# Patient Record
Sex: Male | Born: 1970 | Race: White | Hispanic: No | Smoking: Current every day smoker
Health system: Southern US, Community
[De-identification: ages and names within clinical notes are randomized; demographics above are authoritative.]

## PROBLEM LIST (undated history)

## (undated) DIAGNOSIS — A879 Viral meningitis, unspecified: Secondary | ICD-10-CM

## (undated) DIAGNOSIS — F419 Anxiety disorder, unspecified: Secondary | ICD-10-CM

## (undated) DIAGNOSIS — S32000A Wedge compression fracture of unspecified lumbar vertebra, initial encounter for closed fracture: Secondary | ICD-10-CM

## (undated) DIAGNOSIS — M109 Gout, unspecified: Secondary | ICD-10-CM

## (undated) DIAGNOSIS — I10 Essential (primary) hypertension: Secondary | ICD-10-CM

## (undated) HISTORY — PX: BUNIONECTOMY: SHX129

## (undated) HISTORY — PX: TONSILLECTOMY: SUR1361

---

## 2001-08-25 ENCOUNTER — Emergency Department (HOSPITAL_COMMUNITY): Admission: EM | Admit: 2001-08-25 | Discharge: 2001-08-25 | Payer: Self-pay | Admitting: Emergency Medicine

## 2007-10-30 ENCOUNTER — Emergency Department (HOSPITAL_COMMUNITY): Admission: EM | Admit: 2007-10-30 | Discharge: 2007-10-30 | Payer: Self-pay | Admitting: Emergency Medicine

## 2009-05-16 ENCOUNTER — Encounter: Payer: Self-pay | Admitting: Emergency Medicine

## 2009-05-16 ENCOUNTER — Inpatient Hospital Stay (HOSPITAL_COMMUNITY): Admission: EM | Admit: 2009-05-16 | Discharge: 2009-05-20 | Payer: Self-pay | Admitting: Internal Medicine

## 2009-05-16 DIAGNOSIS — A879 Viral meningitis, unspecified: Secondary | ICD-10-CM | POA: Insufficient documentation

## 2009-05-19 ENCOUNTER — Ambulatory Visit: Payer: Self-pay | Admitting: Infectious Diseases

## 2009-06-19 DIAGNOSIS — J029 Acute pharyngitis, unspecified: Secondary | ICD-10-CM

## 2009-06-19 DIAGNOSIS — J069 Acute upper respiratory infection, unspecified: Secondary | ICD-10-CM | POA: Insufficient documentation

## 2009-06-23 ENCOUNTER — Encounter (INDEPENDENT_AMBULATORY_CARE_PROVIDER_SITE_OTHER): Payer: Self-pay | Admitting: *Deleted

## 2009-06-23 DIAGNOSIS — F1010 Alcohol abuse, uncomplicated: Secondary | ICD-10-CM | POA: Insufficient documentation

## 2009-06-23 DIAGNOSIS — Z9189 Other specified personal risk factors, not elsewhere classified: Secondary | ICD-10-CM

## 2009-06-23 DIAGNOSIS — F17200 Nicotine dependence, unspecified, uncomplicated: Secondary | ICD-10-CM

## 2009-06-24 ENCOUNTER — Ambulatory Visit: Payer: Self-pay | Admitting: Infectious Diseases

## 2009-06-24 LAB — CONVERTED CEMR LAB
ALT: 129 units/L — ABNORMAL HIGH (ref 0–53)
AST: 42 units/L — ABNORMAL HIGH (ref 0–37)
BUN: 12 mg/dL (ref 6–23)
Basophils Relative: 0 % (ref 0–1)
CO2: 22 meq/L (ref 19–32)
Calcium: 9.8 mg/dL (ref 8.4–10.5)
Eosinophils Absolute: 0.2 10*3/uL (ref 0.0–0.7)
Lymphs Abs: 2.6 10*3/uL (ref 0.7–4.0)
Monocytes Absolute: 0.5 10*3/uL (ref 0.1–1.0)
Monocytes Relative: 7 % (ref 3–12)
Potassium: 4 meq/L (ref 3.5–5.3)
RBC: 4.17 M/uL — ABNORMAL LOW (ref 4.22–5.81)
RDW: 14 % (ref 11.5–15.5)
Total Bilirubin: 0.3 mg/dL (ref 0.3–1.2)
WBC: 6.8 10*3/uL (ref 4.0–10.5)

## 2009-06-28 ENCOUNTER — Encounter: Payer: Self-pay | Admitting: Infectious Diseases

## 2009-08-05 ENCOUNTER — Ambulatory Visit: Payer: Self-pay | Admitting: Infectious Diseases

## 2009-09-07 ENCOUNTER — Ambulatory Visit: Payer: Self-pay | Admitting: Infectious Disease

## 2009-09-07 DIAGNOSIS — R0982 Postnasal drip: Secondary | ICD-10-CM | POA: Insufficient documentation

## 2009-09-07 DIAGNOSIS — R630 Anorexia: Secondary | ICD-10-CM

## 2009-09-07 DIAGNOSIS — B009 Herpesviral infection, unspecified: Secondary | ICD-10-CM | POA: Insufficient documentation

## 2009-09-07 DIAGNOSIS — G4700 Insomnia, unspecified: Secondary | ICD-10-CM

## 2009-09-07 DIAGNOSIS — G960 Cerebrospinal fluid leak: Secondary | ICD-10-CM

## 2009-09-08 ENCOUNTER — Ambulatory Visit (HOSPITAL_COMMUNITY): Admission: RE | Admit: 2009-09-08 | Discharge: 2009-09-08 | Payer: Self-pay | Admitting: Infectious Disease

## 2009-09-09 ENCOUNTER — Encounter: Admission: RE | Admit: 2009-09-09 | Discharge: 2009-09-09 | Payer: Self-pay | Admitting: Neurosurgery

## 2009-09-20 ENCOUNTER — Telehealth (INDEPENDENT_AMBULATORY_CARE_PROVIDER_SITE_OTHER): Payer: Self-pay | Admitting: *Deleted

## 2009-09-23 ENCOUNTER — Encounter: Payer: Self-pay | Admitting: Infectious Disease

## 2009-10-21 ENCOUNTER — Ambulatory Visit: Payer: Self-pay | Admitting: Infectious Diseases

## 2010-02-10 NOTE — Consult Note (Signed)
Summary: [facility]   Imported By: Florinda Marker 10/14/2009 11:06:35  _____________________________________________________________________  External Attachment:    Type:   Image     Comment:   External Document

## 2010-02-10 NOTE — Assessment & Plan Note (Signed)
Summary: per [facility] [mkj]   CC:  ? about meds.  History of Present Illness: Gregory Conley is a 40 y/o M with PMH of HSV meningitis who he here with c/c of poor appetite, difficulty sleeping, profuse left sided clear nasal drainageain.  Nasal drainage is not associated with headache, pain, congestion, sore throat, cough, or any other symptoms.   He has a hx of fall approx 5 yrs ago during which he sustained trauma to his nose; he fell face first into drawers after he was tripped by his cat.  He has sustained skull lacerations approx 15 yrs ago from objects falling on his head.  He feels this nasal drainage is occuring more frequently over the past 3-4 weeks.    He reports his has the greatest difficulty falling alseep but is able to stay asleep fairly well.  He has not tried any OTC sleep aids.  He is unsure if he has any increased stress or anxiety contributing to his difficulty sleeping.  Regarding the decreased appetite, pt states he "just doesnt feel like eating."  He denies nausea, vomiting, diarrhea, abdominal pain, changes in bowel habtis, BRBPR, dark black stool, heartburn, difficulty swallowing, dysuria, or hematuria.  He notes 6 lb weight loss over the past 3 weeks. He denies any fever, chills, or other complaint.  He is currently taking one valtrex pill once daily; he is unsure of the dose.    Preventive Screening-Counseling & Management  Alcohol-Tobacco     Alcohol drinks/day: weekends     Alcohol type: beer     Smoking Status: current     Packs/Day: 1.0  Caffeine-Diet-Exercise     Caffeine use/day: sodas     Does Patient Exercise: yes     Type of exercise: active at work  [employer] Use: yes   Current Allergies (reviewed today): ! SULFA ! PCN Past History:  Past medical, surgical, family and social histories (including risk factors) reviewed.  Past Surgical History: Tonsillectomy with adenoids at age 48 for recurrent strep  pharyngitis  Current Medications (verified): 1)  Marinol 2.5 Mg Caps (Dronabinol) .... Take 1 Tablet By Mouth Two Times A Day To Help With Appetite 2)  Trazodone Hcl 50 Mg Tabs (Trazodone Hcl) .... Take 1-2 Tablets By Mouth At Bedtime As Needed For Sleep  Allergies: 1)  ! Sulfa 2)  ! Pcn   Family History: Reviewed history and no changes required. DM2 (uncle) Father deceased (murdered) Grandfather with MI at age 56  Social History: Reviewed history and no changes required. Married. Smoker: 1ppd x 20 yrs EtOH: occasional No illicit drug use.  Vital Signs:  Patient profile:   40 year old male Height:      69 inches (175.26 cm) Weight:      174.4 pounds (79.27 kg) BMI:     25.85 Temp:     97.6 degrees F (36.44 degrees C) oral Pulse rate:   72 / minute BP sitting:   123 / 81  (right arm)  Vitals Entered By: Baxter Hire) (September 07, 2009 8:59 AM) CC: ? about meds Pain Assessment Patient in pain? no      Nutritional Status BMI of 25 - 29 = overweight Nutritional Status Detail appetite is so-so per patient  Does patient need assistance? Functional Status Self care Ambulation Normal   Physical Exam  General:  alert, well-developed, well-nourished, and well-hydrated.   Head:  normocephalic and atraumatic.   Eyes:  vision grossly  intact.  PERRLA.  EOMI. Anicteric.  No conjunctival pallor or injection. Mouth:  good dentition, pharynx pink and moist, no erythema, and no exudates.   Neck:  supple and full ROM.   Lungs:  normal respiratory effort, no accessory muscle use, normal breath sounds, no dullness, no crackles, and no wheezes.   Heart:  normal rate, regular rhythm, no murmur, no gallop, and no rub.   Abdomen:  soft, non-tender, normal bowel sounds, no distention, no masses, and no guarding.   Msk:  normal ROM.   Extremities:  No clubbing, cyanosis, or edema. Neurologic:  alert & oriented X3, cranial nerves II-XII intact, strength normal in all  extremities, sensation intact to light touch, sensation intact to pinprick, and gait normal.   Skin:  turgor normal, color normal, no rashes, no suspicious lesions, no ecchymoses, no petechiae, and no ulcerations.   Cervical Nodes:  no anterior cervical adenopathy and no posterior cervical adenopathy.   Psych:  Oriented X3, memory intact for recent and remote, normally interactive, good eye contact, not anxious appearing, and not depressed appearing.     Impression & Recommendations:  Problem # 1:  CEREBROSPINAL FLUID RHINORRHEA (ICD-349.81) Pts unilateral rhinorrhea is concerning for possible CSF leak given pts hx of numerous head/facial trauma, meningitis, etc.  Discussion with pts wife revealed pt received abx in the ER prior to LP during his 05/2009 hospitalization for meningitis.  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  If the pt has a skull/cribiform plate fx he is at great risk for bacterial meningitis.  Reviewed literature re: CSF leak; will have pt collect a sample of his nasal drainage and will send for Beta-2 transferrin assay.  This test is highly specific for CSF.  Discussed imaging modalities with radiology; pt scheduled for CT head/maxillofacial with and w/o contrast to eval for fx.  WIll also refer pt to neurosurgery for further evaluation.  Orders: Primary Care Referral (Primary) T-HIV Viral Load [phone]) T- * Misc. Laboratory test [phone]) CT with & without Contrast (CT w&w/o Contrast) Neurosurgeon Referral (Neurosurgeon) Est. Patient Level V (91478)  Problem # 2:  LOSS OF APPETITE (ICD-783.0) The cause of pts decreased appetite and weight loss is unclear; he is without any other GI symptoms and is able to tolerate food and fluids by mouth.  It is possible that his depressed appetite is an adverse effect of his suppresive Valtrex.  Advised pt to attempt trial of Valtrex d/c to see if this improves his appetite.   Will also provide him with rx for marinol for symptomatic relief.  Problem # 3:  INSOMNIA (ICD-780.52)  Discussed sleep hygiene and different medications to improve sleep.   Will provide pt with script for Trazadone; this may also help any underlying anxiety/depression the pt is experiencing.  If this does not help at current dosing, will slowly increase the dose to 300mg  by mouth at bedtime; if no relief at that time will try Ambien if pt is still experiencing trouble sleeping.  WIll also refer pt to a PCP for continued managment of his non-infectious disease related conditions.   Greater than with a least 50% face to face time was spent counseling pt and his wife about pts rhinorrhea, insomnia and poor appetite and formulating and discussing plan for continued work up and management of his conditions.  Time was also spent reviewing previous labs, previous office notes and hospital admission/discharge summaries, and coordinating care with  mutliple other health care providers  Medications Added to Medication List This Visit: 1)  Marinol 2.5 Mg Caps (Dronabinol) .... Take 1 tablet by mouth two times a day to help with appetite 2)  Trazodone Hcl 50 Mg Tabs (Trazodone hcl) .... Take 1-2 tablets by mouth at bedtime as needed for sleep  Patient Instructions: 1)  If you develop any fever, chills, or other concerning symptoms call the clinic or go to the ER. 2)  You can try stopping the Valtrex to see if that improves your appetite. 3)  The next time your nose runs, collect a sample in the container provided during your visit.  Keep the sample in the refrigerator until you can bring it to our lab at the clinic. 4)  Please reschedule your appt with Dr. Maurice March to an appt with Dr. Daiva Eves. 5)  You need to see a neurosurgeon. 6)  You also need a primary care doctor.  We will make a referral for you. Prescriptions: TRAZODONE HCL 50 MG TABS (TRAZODONE HCL) Take 1-2 tablets by mouth at bedtime as needed  for sleep  #60 x 6   Entered and Authorized by:   Nelda Bucks DO   Signed by:   Nelda Bucks DO on 09/07/2009   Method used:   Print then Give to Patient   RxID:   [id number] MARINOL 2.5 MG CAPS (DRONABINOL) Take 1 tablet by mouth two times a day to help with appetite  #60 x 2   Entered and Authorized by:   Nelda Bucks DO   Signed by:   Nelda Bucks DO on 09/07/2009   Method used:   Print then Give to Patient   RxID:   [id number] TRAZODONE HCL 50 MG TABS (TRAZODONE HCL) Take 1-2 tablets by mouth at bedtime as needed for sleep  #60 x 6   Entered and Authorized by:   Nelda Bucks DO   Signed by:   Nelda Bucks DO on 09/07/2009   Method used:   Print then Give to Patient   RxID:   [id number] MARINOL 2.5 MG CAPS (DRONABINOL) Take 1 tablet by mouth two times a day to help with appetite  #60 x 2   Entered and Authorized by:   Nelda Bucks DO   Signed by:   Nelda Bucks DO on 09/07/2009   Method used:   Print then Give to Patient   RxID:   [id number]    Prevention & Chronic Care Immunizations   Influenza vaccine: Not documented    Tetanus booster: Not documented    Pneumococcal vaccine: Not documented  Other Screening   Smoking status: current  (09/07/2009)  Lipids   Total Cholesterol: Not documented   LDL: Not documented   LDL Direct: Not documented   HDL: Not documented   Triglycerides: Not documented   Nursing Instructions: Give Pneumovax today Give Flu vaccine today

## 2010-02-10 NOTE — Progress Notes (Signed)
Summary: Problem w/ arm after MRI contrast, exposure to rubella  Phone Note Call from Patient Call back at Home Phone [phone]   Caller: Patient Reason for Call: Talk to Nurse Summary of Call: Pt. having problems following CT and MRI IV access.  Arm giving him problems.  RN advised pt. to call [facility] were he had the MRI to let them know his concerns.  Pt. given telephone number for [facility] [phone]).  Pt. also related that he was recently exposed to "[place] measles" at his place of employment.  Wanted MD to know about the exposure.  Jennet Maduro RN  September 20, 2009 11:07 AM

## 2010-02-10 NOTE — Miscellaneous (Signed)
Summary: HIPAA Restrictions  HIPAA Restrictions   Imported By: Florinda Marker 06/28/2009 15:06:27  _____________________________________________________________________  External Attachment:    Type:   Image     Comment:   External Document

## 2010-02-10 NOTE — Assessment & Plan Note (Signed)
Summary: F/U/VS   CC:  1 month follow up/ right hand swollen.  Preventive Screening-Counseling & Management  Alcohol-Tobacco     Alcohol drinks/day: weekends     Alcohol type: beer     Smoking Status: current     Packs/Day: 1.0  Caffeine-Diet-Exercise     Caffeine use/day: sodas     Type of exercise: walking and active at work     Exercise (avg: min/session): >60     Times/week: 5  Safety-Violence-Falls     Seat Belt Use: yes   Current Allergies: ! SULFA ! PCN Vital Signs:  Patient profile:   40 year old male Height:      69 inches (175.26 cm) Weight:      176.12 pounds (80.05 kg) BMI:     26.10 Temp:     97.9 degrees F (36.61 degrees C) oral Pulse rate:   82 / minute BP sitting:   125 / 88  (left arm)  Vitals Entered By: Baxter Hire) (August 05, 2009 11:38 AM) CC: 1 month follow up/ right hand swollen Pain Assessment Patient in pain? yes     Location: headache Intensity: 2 Type: aching Onset of pain  Constant Nutritional Status BMI of 25 - 29 = overweight Nutritional Status Detail appetite is so-so per patient  Does patient need assistance? Functional Status Self care Ambulation Normal Comments right hand starting swelling after finishing prednisone (finished medicine on Monday 08-02-09) Right hand starting swelling this morning (08-05-09).     Other Orders: Est. Patient Level III (04540)

## 2010-02-10 NOTE — Assessment & Plan Note (Signed)
Summary: F/U OV/VS   CC:  follow-up visit.  History of Present Illness: Gregory Conley feels much and his nerologist who has seen him in referral from Dr Cram,neurosurgeon, placed hijm on Cymbalta SSRI. Since being on this med for past month he feels dramitically better with less confusional like episodes and improved outlook. 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 I believe that he has finally recovered and does not need to return to [facility] for any further f/u. Of course, I informed him that we would be happy to see him if furtherr problems arose. I have distinct feeling that he is recovering.  Preventive Screening-Counseling & Management  Alcohol-Tobacco     Alcohol drinks/day: weekends     Alcohol type: beer     Smoking Status: current     Packs/Day: 1.0  Caffeine-Diet-Exercise     Caffeine use/day: sodas     Does Patient Exercise: yes     Type of exercise: active at work     Exercise (avg: min/session): >60     Times/week: 5  Safety-Violence-Falls     Seat Belt Use: yes   Current Allergies (reviewed today): ! SULFA ! PCN ! ERYTHROMYCIN Vital Signs:  Patient profile:   40 year old male Height:      69 inches (175.26 cm) Weight:      172.12 pounds (78.24 kg) BMI:     25.51 Temp:     97.8 degrees F (36.56 degrees C) oral Pulse rate:   71 / minute BP sitting:   126 / 85  (left arm)  Vitals Entered By: Baxter Hire) (October 21, 2009 10:08 AM) CC: follow-up visit Pain Assessment Patient in pain? no      Nutritional Status BMI of 25 - 29 = overweight Nutritional Status Detail appetite is alright per patient  Does patient need assistance? Functional Status Self care Ambulation Normal    Medications Added to Medication List This Visit: 1)  Valtrex 1 Gm Tabs  (Valacyclovir hcl) .... Take one tablet by mouth daily  Other Orders: Est. Patient Level III (16109)

## 2010-02-10 NOTE — Miscellaneous (Signed)
Summary: Problems, Medications and Allergies updated  Clinical Lists Changes  Problems: Added new problem of VIRAL MENINGITIS (ICD-047.9) Added new problem of TOBACCO ABUSE (ICD-305.1) Added new problem of ALCOHOL ABUSE (ICD-305.00) Added new problem of CANNABIS ABUSE, HX OF (ICD-V15.89) Added new problem of ACUTE PHARYNGITIS (ICD-462) Added new problem of URI (ICD-465.9) Medications: Added new medication of DOXYCYCLINE HYCLATE 100 MG CAPS (DOXYCYCLINE HYCLATE) Take 1 capsule by mouth two times a day per Dr. Hyacinth Meeker @ [facility] Allergies: Added new allergy or adverse reaction of SULFA Added new allergy or adverse reaction of PCN Observations: Added new observation of NKA: F (06/23/2009 16:00)

## 2010-02-10 NOTE — Assessment & Plan Note (Signed)
Summary: [facility] per Kim/RRS   CC:  [facility].  Preventive Screening-Counseling & Management  Alcohol-Tobacco     Alcohol drinks/day: <1     Smoking Status: current     Packs/Day: 1.0  Caffeine-Diet-Exercise     Caffeine use/day: yes     Does Patient Exercise: yes with his work     Type of exercise: walking, lifiting  Safety-Violence-Falls     Seat Belt Use: no     Seat Belt Counseling: not applicable   Current Allergies: ! SULFA ! PCN ! ERYTHROMYCIN Vital Signs:  Patient profile:   40 year old male Height:      68 inches (172.72 cm) Weight:      172.5 pounds (78.41 kg) BMI:     26.32 Temp:     98.5 degrees F (36.94 degrees C) oral Pulse rate:   76 / minute BP sitting:   142 / 87  (left arm) Cuff size:   regular  Vitals Entered By: Jennet Maduro RN (June 24, 2009 1:57 PM) CC: [facility] Is Patient Diabetic? No Pain Assessment Patient in pain? yes     Location: headache Intensity: 3 Type: dul, aching Nutritional Status BMI of 25 - 29 = overweight  Have you ever been in a relationship where you felt threatened, hurt or afraid?not asked wife present   Does patient need assistance? Functional Status Self care Ambulation Normal    Other Orders: T-CBC w/Diff ([id number]) T-Comprehensive Metabolic Panel ([id number]) New Patient Level IV (95638)  Patient Instructions: 1)  Please schedule a follow-up appointment in 4-6 weeks. Prescriptions: ZOVIRAX 400 MG TABS (ACYCLOVIR) Take 1 tablet by mouth once a day  #30 x 0   Entered by:   Nelda Bucks DO   Authorized by:   Lina Sayre MD   Signed by:   Nelda Bucks DO on 09/07/2009   Method used:   Handwritten   RxID:   [id number]   Prevention & Chronic Care Immunizations   Influenza vaccine: Not documented    Tetanus booster: Not documented    Pneumococcal vaccine: Not documented  Other Screening   Smoking status: current  (06/24/2009)  Lipids   Total Cholesterol: Not documented   LDL: Not  documented   LDL Direct: Not documented   HDL: Not documented   Triglycerides: Not documented   Process Orders Check Orders Results:     [facility]: ABN not required for this insurance Tests Sent for requisitioning (September 07, 2009 9:41 AM):     06/24/2009: [facility] -- T-CBC w/Diff [[id number]] (signed)     06/24/2009: [facility] -- T-Comprehensive Metabolic Panel [phone] (signed)

## 2010-03-21 ENCOUNTER — Encounter: Payer: Self-pay | Admitting: *Deleted

## 2010-03-29 LAB — BASIC METABOLIC PANEL
Calcium: 8.4 mg/dL (ref 8.4–10.5)
Creatinine, Ser: 1.14 mg/dL (ref 0.4–1.5)
Glucose, Bld: 101 mg/dL — ABNORMAL HIGH (ref 70–99)
Potassium: 3.6 mEq/L (ref 3.5–5.1)
Sodium: 139 mEq/L (ref 135–145)

## 2010-03-29 LAB — CSF CELL COUNT WITH DIFFERENTIAL
Eosinophils, CSF: 0 % (ref 0–1)
Lymphs, CSF: 95 % — ABNORMAL HIGH (ref 40–80)
Lymphs, CSF: 99 % — ABNORMAL HIGH (ref 40–80)
Monocyte-Macrophage-Spinal Fluid: 1 % — ABNORMAL LOW (ref 15–45)
Segmented Neutrophils-CSF: 0 % (ref 0–6)
Segmented Neutrophils-CSF: 0 % (ref 0–6)
Tube #: 1
WBC, CSF: 513 /mm3 (ref 0–5)
WBC, CSF: 600 /mm3 (ref 0–5)

## 2010-03-29 LAB — CULTURE, BLOOD (ROUTINE X 2): Culture: NO GROWTH

## 2010-03-29 LAB — CBC
HCT: 35.1 % — ABNORMAL LOW (ref 39.0–52.0)
HCT: 44.5 % (ref 39.0–52.0)
Hemoglobin: 15.1 g/dL (ref 13.0–17.0)
MCHC: 35 g/dL (ref 30.0–36.0)
MCV: 100.8 fL — ABNORMAL HIGH (ref 78.0–100.0)
MCV: 100.2 fL — ABNORMAL HIGH (ref 78.0–100.0)
Platelets: 221 10*3/uL (ref 150–400)
RBC: 3.55 MIL/uL — ABNORMAL LOW (ref 4.22–5.81)
RBC: 3.89 MIL/uL — ABNORMAL LOW (ref 4.22–5.81)
RDW: 12.6 % (ref 11.5–15.5)
RDW: 12.8 % (ref 11.5–15.5)
WBC: 7.4 10*3/uL (ref 4.0–10.5)
WBC: 8.3 10*3/uL (ref 4.0–10.5)
WBC: 10.6 10*3/uL — ABNORMAL HIGH (ref 4.0–10.5)

## 2010-03-29 LAB — COMPREHENSIVE METABOLIC PANEL
ALT: 30 U/L (ref 0–53)
AST: 25 U/L (ref 0–37)
Albumin: 3.6 g/dL (ref 3.5–5.2)
Albumin: 4.5 g/dL (ref 3.5–5.2)
Alkaline Phosphatase: 66 U/L (ref 39–117)
Calcium: 8.7 mg/dL (ref 8.4–10.5)
Chloride: 105 mEq/L (ref 96–112)
Creatinine, Ser: 1.07 mg/dL (ref 0.4–1.5)
Creatinine, Ser: 1.31 mg/dL (ref 0.4–1.5)
GFR calc Af Amer: >60 mL/min (ref >60)
GFR calc Af Amer: >60 mL/min (ref >60)
GFR calc non Af Amer: >60 mL/min (ref >60)
Glucose, Bld: 165 mg/dL — ABNORMAL HIGH (ref 70–99)
Total Bilirubin: 0.9 mg/dL (ref 0.3–1.2)

## 2010-03-29 LAB — DIFFERENTIAL
Basophils Absolute: 0 10*3/uL (ref 0.0–0.1)
Basophils Absolute: 0 10*3/uL (ref 0.0–0.1)
Basophils Relative: 0 % (ref 0–1)
Eosinophils Absolute: 0 10*3/uL (ref 0.0–0.7)
Eosinophils Relative: 0 % (ref 0–5)
Lymphocytes Relative: 10 % — ABNORMAL LOW (ref 12–46)
Lymphs Abs: 1 10*3/uL (ref 0.7–4.0)
Lymphs Abs: 1 10*3/uL (ref 0.7–4.0)
Monocytes Absolute: 0.4 10*3/uL (ref 0.1–1.0)
Monocytes Absolute: 0.5 10*3/uL (ref 0.1–1.0)

## 2010-03-29 LAB — URINALYSIS, ROUTINE W REFLEX MICROSCOPIC
Bilirubin Urine: NEGATIVE
Protein, ur: NEGATIVE mg/dL
Specific Gravity, Urine: 1.024 (ref 1.005–1.030)
pH: 8 (ref 5.0–8.0)

## 2010-03-29 LAB — RAPID URINE DRUG SCREEN, HOSP PERFORMED
Barbiturates: NOT DETECTED
Opiates: POSITIVE — AB

## 2010-03-29 LAB — HIV ANTIBODY (ROUTINE TESTING W REFLEX): HIV: NONREACTIVE

## 2010-03-29 LAB — HERPES SIMPLEX VIRUS(HSV) DNA BY PCR: HSV 2 DNA: DETECTED

## 2010-03-29 LAB — URINE CULTURE
Culture: NO GROWTH
Special Requests: NEGATIVE

## 2010-03-29 LAB — CSF CULTURE W GRAM STAIN: Culture: NO GROWTH

## 2010-03-29 LAB — ROCKY MTN SPOTTED FVR AB, IGM-BLOOD: RMSF IgM: 0.12 IV (ref 0.00–0.89)

## 2010-03-29 LAB — B. BURGDORFI ANTIBODIES: B burgdorferi Ab IgG+IgM: 0.16 {ISR}

## 2010-03-29 LAB — GLUCOSE, CSF: Glucose, CSF: 62 mg/dL (ref 43–76)

## 2010-03-29 LAB — RPR: RPR Ser Ql: NONREACTIVE

## 2010-03-29 LAB — MRSA PCR SCREENING: MRSA by PCR: NEGATIVE

## 2010-03-30 ENCOUNTER — Other Ambulatory Visit: Payer: Self-pay | Admitting: *Deleted

## 2010-03-30 DIAGNOSIS — B20 Human immunodeficiency virus [HIV] disease: Secondary | ICD-10-CM

## 2010-03-30 MED ORDER — DRONABINOL 2.5 MG PO CAPS: 2.5000 mg | ORAL_CAPSULE | Freq: Two times a day (BID) | ORAL | Status: DC

## 2010-04-06 ENCOUNTER — Other Ambulatory Visit: Payer: Self-pay | Admitting: *Deleted

## 2010-04-06 DIAGNOSIS — R630 Anorexia: Secondary | ICD-10-CM

## 2010-04-06 DIAGNOSIS — B20 Human immunodeficiency virus [HIV] disease: Secondary | ICD-10-CM

## 2010-04-06 MED ORDER — DRONABINOL 2.5 MG PO CAPS: 2.5000 mg | ORAL_CAPSULE | Freq: Two times a day (BID) | ORAL | Status: DC

## 2010-04-07 ENCOUNTER — Other Ambulatory Visit: Payer: Self-pay | Admitting: *Deleted

## 2010-04-07 DIAGNOSIS — R630 Anorexia: Secondary | ICD-10-CM

## 2010-04-07 DIAGNOSIS — B20 Human immunodeficiency virus [HIV] disease: Secondary | ICD-10-CM

## 2010-04-07 MED ORDER — DRONABINOL 2.5 MG PO CAPS: 2.5000 mg | ORAL_CAPSULE | Freq: Two times a day (BID) | ORAL | Status: DC

## 2010-05-17 ENCOUNTER — Other Ambulatory Visit: Payer: Self-pay | Admitting: *Deleted

## 2010-05-17 DIAGNOSIS — B20 Human immunodeficiency virus [HIV] disease: Secondary | ICD-10-CM

## 2010-05-17 DIAGNOSIS — R630 Anorexia: Secondary | ICD-10-CM

## 2010-05-17 MED ORDER — DRONABINOL 2.5 MG PO CAPS: 2.5000 mg | ORAL_CAPSULE | Freq: Two times a day (BID) | ORAL | Status: DC

## 2010-06-09 ENCOUNTER — Ambulatory Visit: Payer: Self-pay | Admitting: Infectious Diseases

## 2010-10-11 LAB — POCT I-STAT, CHEM 8
Creatinine, Ser: 1.2
HCT: 45
Hemoglobin: 15.3
Potassium: 3.9
Sodium: 142
TCO2: 25

## 2011-06-24 ENCOUNTER — Other Ambulatory Visit: Payer: Self-pay | Admitting: Infectious Diseases

## 2012-01-10 HISTORY — PX: SHOULDER SURGERY: SHX246

## 2012-09-23 ENCOUNTER — Emergency Department: Payer: Self-pay | Admitting: Emergency Medicine

## 2016-04-15 ENCOUNTER — Encounter (HOSPITAL_COMMUNITY): Payer: Self-pay

## 2016-04-15 ENCOUNTER — Emergency Department (HOSPITAL_COMMUNITY): Payer: BLUE CROSS/BLUE SHIELD

## 2016-04-15 ENCOUNTER — Emergency Department (HOSPITAL_COMMUNITY)
Admission: EM | Admit: 2016-04-15 | Discharge: 2016-04-15 | Disposition: A | Discharge status: Home / Self Care | Payer: BLUE CROSS/BLUE SHIELD | Attending: Emergency Medicine | Admitting: Emergency Medicine

## 2016-04-15 DIAGNOSIS — Z79899 Other long term (current) drug therapy: Secondary | ICD-10-CM | POA: Diagnosis not present

## 2016-04-15 DIAGNOSIS — I10 Essential (primary) hypertension: Secondary | ICD-10-CM | POA: Insufficient documentation

## 2016-04-15 DIAGNOSIS — R05 Cough: Secondary | ICD-10-CM | POA: Diagnosis present

## 2016-04-15 DIAGNOSIS — F17210 Nicotine dependence, cigarettes, uncomplicated: Secondary | ICD-10-CM | POA: Insufficient documentation

## 2016-04-15 DIAGNOSIS — J189 Pneumonia, unspecified organism: Secondary | ICD-10-CM

## 2016-04-15 HISTORY — DX: Gout, unspecified: M10.9

## 2016-04-15 HISTORY — DX: Viral meningitis, unspecified: A87.9

## 2016-04-15 HISTORY — DX: Anxiety disorder, unspecified: F41.9

## 2016-04-15 HISTORY — DX: Essential (primary) hypertension: I10

## 2016-04-15 LAB — CBC
HCT: 40.3 % (ref 39.0–52.0)
HEMOGLOBIN: 14.1 g/dL (ref 13.0–17.0)
MCH: 33.4 pg (ref 26.0–34.0)
MCHC: 35 g/dL (ref 30.0–36.0)
MCV: 95.5 fL (ref 78.0–100.0)
Platelets: 267 10*3/uL (ref 150–400)
RBC: 4.22 MIL/uL (ref 4.22–5.81)
RDW: 12.3 % (ref 11.5–15.5)
WBC: 16.7 10*3/uL — ABNORMAL HIGH (ref 4.0–10.5)

## 2016-04-15 LAB — URINALYSIS, ROUTINE W REFLEX MICROSCOPIC
Bilirubin Urine: NEGATIVE
Glucose, UA: NEGATIVE mg/dL
HGB URINE DIPSTICK: NEGATIVE
Ketones, ur: NEGATIVE mg/dL
LEUKOCYTES UA: NEGATIVE
Nitrite: NEGATIVE
Protein, ur: NEGATIVE mg/dL
Specific Gravity, Urine: 1.016 (ref 1.005–1.030)
pH: 8 (ref 5.0–8.0)

## 2016-04-15 LAB — COMPREHENSIVE METABOLIC PANEL
ALK PHOS: 60 U/L (ref 38–126)
ALT: 14 U/L — ABNORMAL LOW (ref 17–63)
ANION GAP: 12 (ref 5–15)
AST: 20 U/L (ref 15–41)
Albumin: 3.6 g/dL (ref 3.5–5.0)
BILIRUBIN TOTAL: 0.9 mg/dL (ref 0.3–1.2)
BUN: 13 mg/dL (ref 6–20)
CALCIUM: 9.4 mg/dL (ref 8.9–10.3)
CO2: 22 mmol/L (ref 22–32)
CREATININE: 1.12 mg/dL (ref 0.61–1.24)
Chloride: 104 mmol/L (ref 101–111)
GFR calc non Af Amer: >60 mL/min (ref >60)
Glucose, Bld: 121 mg/dL — ABNORMAL HIGH (ref 65–99)
Potassium: 3.7 mmol/L (ref 3.5–5.1)
Sodium: 138 mmol/L (ref 135–145)
TOTAL PROTEIN: 6.6 g/dL (ref 6.5–8.1)

## 2016-04-15 LAB — I-STAT CG4 LACTIC ACID, ED: LACTIC ACID, VENOUS: 1.79 mmol/L (ref 0.5–1.9)

## 2016-04-15 LAB — RAPID URINE DRUG SCREEN, HOSP PERFORMED
Amphetamines: NOT DETECTED
BARBITURATES: NOT DETECTED
Benzodiazepines: NOT DETECTED
COCAINE: NOT DETECTED
Opiates: NOT DETECTED
TETRAHYDROCANNABINOL: POSITIVE — AB

## 2016-04-15 LAB — INFLUENZA PANEL BY PCR (TYPE A & B)
INFLAPCR: NEGATIVE
Influenza B By PCR: NEGATIVE

## 2016-04-15 LAB — LIPASE, BLOOD: Lipase: 23 U/L (ref 11–51)

## 2016-04-15 MED ORDER — AEROCHAMBER PLUS FLO-VU LARGE MISC: 1.0000 | Freq: Once | Status: DC

## 2016-04-15 MED ORDER — KETOROLAC TROMETHAMINE 30 MG/ML IJ SOLN
30.0000 mg | Freq: Once | INTRAMUSCULAR | Status: AC
  Administered 2016-04-15: 30 mg via INTRAVENOUS
  Filled 2016-04-15: qty 1

## 2016-04-15 MED ORDER — ONDANSETRON 4 MG PO TBDP
4.0000 mg | ORAL_TABLET | Freq: Once | ORAL | Status: AC | PRN
  Administered 2016-04-15: 4 mg via ORAL
  Filled 2016-04-15: qty 1

## 2016-04-15 MED ORDER — ALBUTEROL SULFATE HFA 108 (90 BASE) MCG/ACT IN AERS
2.0000 | INHALATION_SPRAY | Freq: Once | RESPIRATORY_TRACT | Status: AC
  Administered 2016-04-15: 2 via RESPIRATORY_TRACT
  Filled 2016-04-15: qty 6.7

## 2016-04-15 MED ORDER — IBUPROFEN 600 MG PO TABS: 600.0000 mg | ORAL_TABLET | Freq: Four times a day (QID) | ORAL | 0 refills | Status: AC | PRN

## 2016-04-15 MED ORDER — ONDANSETRON 4 MG PO TBDP: 4.0000 mg | ORAL_TABLET | ORAL | 0 refills | Status: DC | PRN

## 2016-04-15 MED ORDER — HYDROCODONE-HOMATROPINE 5-1.5 MG/5ML PO SYRP: 5.0000 mL | ORAL_SOLUTION | Freq: Four times a day (QID) | ORAL | 0 refills | Status: DC | PRN

## 2016-04-15 MED ORDER — HYDROCODONE-HOMATROPINE 5-1.5 MG/5ML PO SYRP
10.0000 mL | ORAL_SOLUTION | Freq: Once | ORAL | Status: AC
  Administered 2016-04-15: 10 mL via ORAL
  Filled 2016-04-15: qty 10

## 2016-04-15 MED ORDER — SODIUM CHLORIDE 0.9 % IV BOLUS (SEPSIS)
1000.0000 mL | Freq: Once | INTRAVENOUS | Status: AC
  Administered 2016-04-15: 1000 mL via INTRAVENOUS

## 2016-04-15 MED ORDER — AZITHROMYCIN 250 MG PO TABS: ORAL_TABLET | ORAL | 0 refills | Status: DC

## 2016-04-15 MED ORDER — AEROCHAMBER PLUS FLO-VU MEDIUM MISC
1.0000 | Freq: Once | Status: DC
  Filled 2016-04-15: qty 1

## 2016-04-15 MED ORDER — IPRATROPIUM-ALBUTEROL 0.5-2.5 (3) MG/3ML IN SOLN
3.0000 mL | Freq: Once | RESPIRATORY_TRACT | Status: AC
  Administered 2016-04-15: 3 mL via RESPIRATORY_TRACT
  Filled 2016-04-15: qty 3

## 2016-04-15 NOTE — ED Triage Notes (Signed)
Pt presents via [facility] from [facility] for evaluation of multiple complaints and "not feeling well" reports has not been taking prescribed medication "for awhile." Pt is hyperventilating and vomiting on arrival to ED. Pt not providing verbal hx to RN at this time.
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
## 2016-04-20 LAB — CULTURE, BLOOD (ROUTINE X 2)
CULTURE: NO GROWTH
SPECIAL REQUESTS: ADEQUATE

## 2016-11-20 ENCOUNTER — Other Ambulatory Visit: Payer: Self-pay

## 2016-11-20 ENCOUNTER — Emergency Department (HOSPITAL_COMMUNITY)
Admission: EM | Admit: 2016-11-20 | Discharge: 2016-11-20 | Disposition: A | Discharge status: Home / Self Care | Payer: BLUE CROSS/BLUE SHIELD | Attending: Emergency Medicine | Admitting: Emergency Medicine

## 2016-11-20 ENCOUNTER — Encounter (HOSPITAL_COMMUNITY): Payer: Self-pay | Admitting: *Deleted

## 2016-11-20 DIAGNOSIS — F17210 Nicotine dependence, cigarettes, uncomplicated: Secondary | ICD-10-CM | POA: Diagnosis not present

## 2016-11-20 DIAGNOSIS — I10 Essential (primary) hypertension: Secondary | ICD-10-CM | POA: Diagnosis not present

## 2016-11-20 DIAGNOSIS — Z79899 Other long term (current) drug therapy: Secondary | ICD-10-CM | POA: Insufficient documentation

## 2016-11-20 DIAGNOSIS — Z139 Encounter for screening, unspecified: Secondary | ICD-10-CM | POA: Diagnosis not present

## 2016-11-20 LAB — SALICYLATE LEVEL: Salicylate Lvl: <7 mg/dL (ref 2.8–30.0)

## 2016-11-20 LAB — COMPREHENSIVE METABOLIC PANEL
ALBUMIN: 4.3 g/dL (ref 3.5–5.0)
ALK PHOS: 47 U/L (ref 38–126)
ALT: 10 U/L — AB (ref 17–63)
AST: 15 U/L (ref 15–41)
Anion gap: 9 (ref 5–15)
BUN: 10 mg/dL (ref 6–20)
CALCIUM: 9.4 mg/dL (ref 8.9–10.3)
CO2: 26 mmol/L (ref 22–32)
CREATININE: 0.93 mg/dL (ref 0.61–1.24)
Chloride: 106 mmol/L (ref 101–111)
GFR calc Af Amer: >60 mL/min (ref >60)
GFR calc non Af Amer: >60 mL/min (ref >60)
GLUCOSE: 112 mg/dL — AB (ref 65–99)
Potassium: 3.5 mmol/L (ref 3.5–5.1)
SODIUM: 141 mmol/L (ref 135–145)
Total Bilirubin: 0.7 mg/dL (ref 0.3–1.2)
Total Protein: 6.8 g/dL (ref 6.5–8.1)

## 2016-11-20 LAB — ETHANOL: Alcohol, Ethyl (B): <10 mg/dL (ref <10)

## 2016-11-20 LAB — RAPID URINE DRUG SCREEN, HOSP PERFORMED
AMPHETAMINES: NOT DETECTED
BARBITURATES: NOT DETECTED
Benzodiazepines: POSITIVE — AB
COCAINE: NOT DETECTED
Opiates: NOT DETECTED
TETRAHYDROCANNABINOL: POSITIVE — AB

## 2016-11-20 LAB — CBC
HEMATOCRIT: 42.7 % (ref 39.0–52.0)
HEMOGLOBIN: 15.1 g/dL (ref 13.0–17.0)
MCH: 34.1 pg — ABNORMAL HIGH (ref 26.0–34.0)
MCHC: 35.4 g/dL (ref 30.0–36.0)
MCV: 96.4 fL (ref 78.0–100.0)
Platelets: 239 10*3/uL (ref 150–400)
RBC: 4.43 MIL/uL (ref 4.22–5.81)
RDW: 12.6 % (ref 11.5–15.5)
WBC: 10.5 10*3/uL (ref 4.0–10.5)

## 2016-11-20 LAB — ACETAMINOPHEN LEVEL
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
## 2016-11-20 NOTE — [facility] Assessment (Signed)
[facility] Assessment Progress Note  Per Nira ConnJason Berry, NP pt is psych cleared and stable for d/c. EDP Gregory Horsemanobert Browning, PA and Dr. Read DriversMolpus, MD agree with disposition and states the IVC will be rescinded. Pt's nurse Gregory Favrescar, RN has been notified of recommendation.   Gregory Conley, MSW, LCSW Therapeutic Triage Specialist  [phone]
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
## 2016-11-20 NOTE — ED Notes (Signed)
Pt's personal items are placed in the cabinet above the nurse's desk at 19-22 side. His wallet is locked up with security for safekeeping.

## 2017-02-14 ENCOUNTER — Encounter (HOSPITAL_BASED_OUTPATIENT_CLINIC_OR_DEPARTMENT_OTHER): Payer: Self-pay | Admitting: *Deleted

## 2017-02-14 ENCOUNTER — Other Ambulatory Visit: Payer: Self-pay

## 2017-02-15 ENCOUNTER — Other Ambulatory Visit: Payer: Self-pay | Admitting: Orthopedic Surgery

## 2017-02-18 ENCOUNTER — Encounter (HOSPITAL_COMMUNITY): Payer: Self-pay | Admitting: Emergency Medicine

## 2017-02-18 ENCOUNTER — Emergency Department (HOSPITAL_COMMUNITY)
Admission: EM | Admit: 2017-02-18 | Discharge: 2017-02-18 | Disposition: A | Discharge status: Home / Self Care | Payer: 59 | Attending: Emergency Medicine | Admitting: Emergency Medicine

## 2017-02-18 ENCOUNTER — Emergency Department (HOSPITAL_COMMUNITY): Payer: 59

## 2017-02-18 ENCOUNTER — Other Ambulatory Visit: Payer: Self-pay

## 2017-02-18 DIAGNOSIS — Z79899 Other long term (current) drug therapy: Secondary | ICD-10-CM | POA: Diagnosis not present

## 2017-02-18 DIAGNOSIS — Y929 Unspecified place or not applicable: Secondary | ICD-10-CM | POA: Insufficient documentation

## 2017-02-18 DIAGNOSIS — I10 Essential (primary) hypertension: Secondary | ICD-10-CM | POA: Diagnosis not present

## 2017-02-18 DIAGNOSIS — S0990XA Unspecified injury of head, initial encounter: Secondary | ICD-10-CM

## 2017-02-18 DIAGNOSIS — Z23 Encounter for immunization: Secondary | ICD-10-CM | POA: Diagnosis not present

## 2017-02-18 DIAGNOSIS — Y999 Unspecified external cause status: Secondary | ICD-10-CM | POA: Diagnosis not present

## 2017-02-18 DIAGNOSIS — F17210 Nicotine dependence, cigarettes, uncomplicated: Secondary | ICD-10-CM | POA: Insufficient documentation

## 2017-02-18 DIAGNOSIS — Y939 Activity, unspecified: Secondary | ICD-10-CM | POA: Diagnosis not present

## 2017-02-18 DIAGNOSIS — S022XXA Fracture of nasal bones, initial encounter for closed fracture: Secondary | ICD-10-CM | POA: Diagnosis not present

## 2017-02-18 MED ORDER — CEPHALEXIN 500 MG PO CAPS: 500.0000 mg | ORAL_CAPSULE | Freq: Four times a day (QID) | ORAL | 0 refills | Status: AC

## 2017-02-18 MED ORDER — ONDANSETRON 4 MG PO TBDP: 4.0000 mg | ORAL_TABLET | Freq: Three times a day (TID) | ORAL | 0 refills | Status: AC | PRN

## 2017-02-18 MED ORDER — TETANUS-DIPHTH-ACELL PERTUSSIS 5-2.5-18.5 LF-MCG/0.5 IM SUSP
0.5000 mL | Freq: Once | INTRAMUSCULAR | Status: AC
  Administered 2017-02-18: 0.5 mL via INTRAMUSCULAR
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
## 2017-02-22 ENCOUNTER — Ambulatory Visit (HOSPITAL_BASED_OUTPATIENT_CLINIC_OR_DEPARTMENT_OTHER): Payer: 59 | Admitting: Anesthesiology

## 2017-02-22 ENCOUNTER — Ambulatory Visit (HOSPITAL_BASED_OUTPATIENT_CLINIC_OR_DEPARTMENT_OTHER)
Admission: RE | Admit: 2017-02-22 | Discharge: 2017-02-22 | Disposition: A | Discharge status: Home / Self Care | Payer: 59 | Source: Ambulatory Visit | Attending: Orthopedic Surgery | Admitting: Orthopedic Surgery

## 2017-02-22 ENCOUNTER — Encounter (HOSPITAL_BASED_OUTPATIENT_CLINIC_OR_DEPARTMENT_OTHER)
Admission: RE | Disposition: A | Discharge status: Home / Self Care | Payer: Self-pay | Source: Ambulatory Visit | Attending: Orthopedic Surgery

## 2017-02-22 ENCOUNTER — Other Ambulatory Visit: Payer: Self-pay

## 2017-02-22 ENCOUNTER — Encounter (HOSPITAL_BASED_OUTPATIENT_CLINIC_OR_DEPARTMENT_OTHER): Payer: Self-pay | Admitting: Anesthesiology

## 2017-02-22 DIAGNOSIS — Z9889 Other specified postprocedural states: Secondary | ICD-10-CM | POA: Insufficient documentation

## 2017-02-22 DIAGNOSIS — Z87892 Personal history of anaphylaxis: Secondary | ICD-10-CM | POA: Insufficient documentation

## 2017-02-22 DIAGNOSIS — Z882 Allergy status to sulfonamides status: Secondary | ICD-10-CM | POA: Insufficient documentation

## 2017-02-22 DIAGNOSIS — Z79899 Other long term (current) drug therapy: Secondary | ICD-10-CM | POA: Diagnosis not present

## 2017-02-22 DIAGNOSIS — F329 Major depressive disorder, single episode, unspecified: Secondary | ICD-10-CM | POA: Diagnosis not present

## 2017-02-22 DIAGNOSIS — F17210 Nicotine dependence, cigarettes, uncomplicated: Secondary | ICD-10-CM | POA: Insufficient documentation

## 2017-02-22 DIAGNOSIS — F419 Anxiety disorder, unspecified: Secondary | ICD-10-CM | POA: Diagnosis not present

## 2017-02-22 DIAGNOSIS — Z881 Allergy status to other antibiotic agents status: Secondary | ICD-10-CM | POA: Diagnosis not present

## 2017-02-22 DIAGNOSIS — Z880 Allergy status to penicillin: Secondary | ICD-10-CM | POA: Insufficient documentation

## 2017-02-22 DIAGNOSIS — L720 Epidermal cyst: Secondary | ICD-10-CM | POA: Insufficient documentation

## 2017-02-22 DIAGNOSIS — Z8661 Personal history of infections of the central nervous system: Secondary | ICD-10-CM | POA: Diagnosis not present

## 2017-02-22 DIAGNOSIS — R2230 Localized swelling, mass and lump, unspecified upper limb: Secondary | ICD-10-CM | POA: Diagnosis present

## 2017-02-22 HISTORY — PX: MASS EXCISION: SHX2000

## 2017-02-22 HISTORY — DX: Wedge compression fracture of unspecified lumbar vertebra, initial encounter for closed fracture: S32.000A

## 2017-02-22 SURGERY — EXCISION MASS
Anesthesia: General | Site: Finger | Laterality: Right

## 2017-02-22 MED ORDER — PROPOFOL 10 MG/ML IV BOLUS
INTRAVENOUS | Status: DC | PRN
  Administered 2017-02-22: 250 mg via INTRAVENOUS

## 2017-02-22 MED ORDER — VANCOMYCIN HCL IN DEXTROSE 1-5 GM/200ML-% IV SOLN
INTRAVENOUS | Status: AC
  Filled 2017-02-22: qty 200

## 2017-02-22 MED ORDER — KETOROLAC TROMETHAMINE 30 MG/ML IJ SOLN
INTRAMUSCULAR | Status: AC
  Filled 2017-02-22: qty 1

## 2017-02-22 MED ORDER — ONDANSETRON HCL 4 MG/2ML IJ SOLN
INTRAMUSCULAR | Status: AC
  Filled 2017-02-22: qty 2

## 2017-02-22 MED ORDER — DEXAMETHASONE SODIUM PHOSPHATE 4 MG/ML IJ SOLN
INTRAMUSCULAR | Status: DC | PRN
  Administered 2017-02-22: 10 mg via INTRAVENOUS

## 2017-02-22 MED ORDER — VANCOMYCIN HCL IN DEXTROSE 1-5 GM/200ML-% IV SOLN
1000.0000 mg | INTRAVENOUS | Status: AC
  Administered 2017-02-22: 1000 mg via INTRAVENOUS

## 2017-02-22 MED ORDER — FENTANYL CITRATE (PF) 100 MCG/2ML IJ SOLN
INTRAMUSCULAR | Status: AC
  Filled 2017-02-22: qty 2

## 2017-02-22 MED ORDER — MIDAZOLAM HCL 5 MG/5ML IJ SOLN
INTRAMUSCULAR | Status: DC | PRN
  Administered 2017-02-22: 2 mg via INTRAVENOUS

## 2017-02-22 MED ORDER — SCOPOLAMINE 1 MG/3DAYS TD PT72: 1.0000 | MEDICATED_PATCH | Freq: Once | TRANSDERMAL | Status: DC | PRN

## 2017-02-22 MED ORDER — MEPERIDINE HCL 25 MG/ML IJ SOLN: 6.2500 mg | INTRAMUSCULAR | Status: DC | PRN

## 2017-02-22 MED ORDER — METOCLOPRAMIDE HCL 5 MG/ML IJ SOLN: 10.0000 mg | Freq: Once | INTRAMUSCULAR | Status: DC | PRN

## 2017-02-22 MED ORDER — FENTANYL CITRATE (PF) 100 MCG/2ML IJ SOLN
INTRAMUSCULAR | Status: DC | PRN
  Administered 2017-02-22: 25 ug via INTRAVENOUS
  Administered 2017-02-22: 100 ug via INTRAVENOUS

## 2017-02-22 MED ORDER — FENTANYL CITRATE (PF) 100 MCG/2ML IJ SOLN: 25.0000 ug | INTRAMUSCULAR | Status: DC | PRN

## 2017-02-22 MED ORDER — HYDROCODONE-ACETAMINOPHEN 5-325 MG PO TABS: ORAL_TABLET | ORAL | 0 refills | Status: AC

## 2017-02-22 MED ORDER — ONDANSETRON HCL 4 MG/2ML IJ SOLN
INTRAMUSCULAR | Status: DC | PRN
  Administered 2017-02-22: 4 mg via INTRAVENOUS

## 2017-02-22 MED ORDER — PROPOFOL 10 MG/ML IV BOLUS
INTRAVENOUS | Status: AC
  Filled 2017-02-22: qty 20

## 2017-02-22 MED ORDER — KETOROLAC TROMETHAMINE 30 MG/ML IJ SOLN
INTRAMUSCULAR | Status: DC | PRN
  Administered 2017-02-22: 30 mg via INTRAVENOUS

## 2017-02-22 MED ORDER — LACTATED RINGERS IV SOLN: INTRAVENOUS | Status: DC

## 2017-02-22 MED ORDER — BUPIVACAINE HCL (PF) 0.25 % IJ SOLN
INTRAMUSCULAR | Status: DC | PRN
  Administered 2017-02-22: 8 mL

## 2017-02-22 MED ORDER — MIDAZOLAM HCL 2 MG/2ML IJ SOLN: 1.0000 mg | INTRAMUSCULAR | Status: DC | PRN

## 2017-02-22 MED ORDER — LIDOCAINE HCL (CARDIAC) 20 MG/ML IV SOLN
INTRAVENOUS | Status: DC | PRN
  Administered 2017-02-22: 30 mg via INTRAVENOUS

## 2017-02-22 MED ORDER — GLYCOPYRROLATE 0.2 MG/ML IJ SOLN
INTRAMUSCULAR | Status: DC | PRN
  Administered 2017-02-22: 0.1 mg via INTRAVENOUS

## 2017-02-22 MED ORDER — FENTANYL CITRATE (PF) 100 MCG/2ML IJ SOLN: 50.0000 ug | INTRAMUSCULAR | Status: DC | PRN

## 2017-02-22 MED ORDER — CHLORHEXIDINE GLUCONATE 4 % EX LIQD: 60.0000 mL | Freq: Once | CUTANEOUS | Status: DC

## 2017-02-22 MED ORDER — DEXAMETHASONE SODIUM PHOSPHATE 10 MG/ML IJ SOLN
INTRAMUSCULAR | Status: AC
  Filled 2017-02-22: qty 1

## 2017-02-22 MED ORDER — LACTATED RINGERS IV SOLN
INTRAVENOUS | Status: DC
  Administered 2017-02-22 (×2): via INTRAVENOUS

## 2017-02-22 MED ORDER — MIDAZOLAM HCL 2 MG/2ML IJ SOLN
INTRAMUSCULAR | Status: AC
  Filled 2017-02-22: qty 2

## 2017-02-22 SURGICAL SUPPLY — 59 items
APL SKNCLS STERI-STRIP NONHPOA (GAUZE/BANDAGES/DRESSINGS)
BANDAGE ACE 3X5.8 VEL STRL LF (GAUZE/BANDAGES/DRESSINGS) IMPLANT
BANDAGE COBAN STERILE 2 (GAUZE/BANDAGES/DRESSINGS) IMPLANT
BENZOIN TINCTURE PRP APPL 2/3 (GAUZE/BANDAGES/DRESSINGS) IMPLANT
BLADE MINI RND TIP GREEN BEAV (BLADE) IMPLANT
BLADE SURG 15 STRL LF DISP TIS (BLADE) ×2 IMPLANT
BLADE SURG 15 STRL SS (BLADE) ×6
BNDG CMPR 9X4 STRL LF SNTH (GAUZE/BANDAGES/DRESSINGS) ×1
BNDG COHESIVE 1X5 TAN STRL LF (GAUZE/BANDAGES/DRESSINGS) ×2 IMPLANT
BNDG CONFORM 2 STRL LF (GAUZE/BANDAGES/DRESSINGS) IMPLANT
BNDG ELASTIC 2X5.8 VLCR STR LF (GAUZE/BANDAGES/DRESSINGS) IMPLANT
BNDG ESMARK 4X9 LF (GAUZE/BANDAGES/DRESSINGS) ×2 IMPLANT
BNDG GAUZE 1X2.1 STRL (MISCELLANEOUS) IMPLANT
BNDG GAUZE ELAST 4 BULKY (GAUZE/BANDAGES/DRESSINGS) IMPLANT
BNDG PLASTER X FAST 3X3 WHT LF (CAST SUPPLIES) IMPLANT
BNDG PLSTR 9X3 FST ST WHT (CAST SUPPLIES)
CHLORAPREP W/TINT 26ML (MISCELLANEOUS) ×3 IMPLANT
CLOSURE WOUND 1/2 X4 (GAUZE/BANDAGES/DRESSINGS)
CORD BIPOLAR FORCEPS 12FT (ELECTRODE) ×3 IMPLANT
COVER BACK TABLE 60X90IN (DRAPES) ×3 IMPLANT
COVER MAYO STAND STRL (DRAPES) ×3 IMPLANT
CUFF TOURNIQUET SINGLE 18IN (TOURNIQUET CUFF) ×3 IMPLANT
DRAPE EXTREMITY T 121X128X90 (DRAPE) ×3 IMPLANT
DRAPE SURG 17X23 STRL (DRAPES) ×3 IMPLANT
GAUZE SPONGE 4X4 12PLY STRL (GAUZE/BANDAGES/DRESSINGS) ×3 IMPLANT
GAUZE SPONGE 4X4 12PLY STRL LF (GAUZE/BANDAGES/DRESSINGS) ×2 IMPLANT
GAUZE XEROFORM 1X8 LF (GAUZE/BANDAGES/DRESSINGS) ×3 IMPLANT
GLOVE BIO SURGEON STRL SZ 6.5 (GLOVE) ×1 IMPLANT
GLOVE BIO SURGEON STRL SZ7.5 (GLOVE) ×3 IMPLANT
GLOVE BIO SURGEONS STRL SZ 6.5 (GLOVE) ×1
GLOVE BIOGEL PI IND STRL 7.0 (GLOVE) IMPLANT
GLOVE BIOGEL PI IND STRL 8 (GLOVE) ×1 IMPLANT
GLOVE BIOGEL PI INDICATOR 7.0 (GLOVE) ×4
GLOVE BIOGEL PI INDICATOR 8 (GLOVE) ×2
GOWN STRL REUS W/ TWL LRG LVL3 (GOWN DISPOSABLE) ×1 IMPLANT
GOWN STRL REUS W/ TWL XL LVL3 (GOWN DISPOSABLE) IMPLANT
GOWN STRL REUS W/TWL LRG LVL3 (GOWN DISPOSABLE)
GOWN STRL REUS W/TWL XL LVL3 (GOWN DISPOSABLE) ×6 IMPLANT
NDL HYPO 25X1 1.5 SAFETY (NEEDLE) ×1 IMPLANT
NEEDLE HYPO 25X1 1.5 SAFETY (NEEDLE) ×3 IMPLANT
NS IRRIG 1000ML POUR BTL (IV SOLUTION) ×3 IMPLANT
PACK BASIN DAY SURGERY FS (CUSTOM PROCEDURE TRAY) ×3 IMPLANT
PAD CAST 3X4 CTTN HI CHSV (CAST SUPPLIES) IMPLANT
PAD CAST 4YDX4 CTTN HI CHSV (CAST SUPPLIES) IMPLANT
PADDING CAST ABS 4INX4YD NS (CAST SUPPLIES)
PADDING CAST ABS COTTON 4X4 ST (CAST SUPPLIES) ×1 IMPLANT
PADDING CAST COTTON 3X4 STRL (CAST SUPPLIES)
PADDING CAST COTTON 4X4 STRL (CAST SUPPLIES)
STOCKINETTE 4X48 STRL (DRAPES) ×3 IMPLANT
STRIP CLOSURE SKIN 1/2X4 (GAUZE/BANDAGES/DRESSINGS) IMPLANT
SUT ETHILON 3 0 PS 1 (SUTURE) IMPLANT
SUT ETHILON 4 0 PS 2 18 (SUTURE) ×3 IMPLANT
SUT ETHILON 5 0 P 3 18 (SUTURE)
SUT NYLON ETHILON 5-0 P-3 1X18 (SUTURE) IMPLANT
SUT VIC AB 4-0 P2 18 (SUTURE) IMPLANT
SYR BULB 3OZ (MISCELLANEOUS) ×3 IMPLANT
SYR CONTROL 10ML LL (SYRINGE) ×3 IMPLANT
TOWEL OR 17X24 6PK STRL BLUE (TOWEL DISPOSABLE) ×6 IMPLANT
UNDERPAD 30X30 (UNDERPADS AND DIAPERS) ×3 IMPLANT

## 2017-02-22 NOTE — Anesthesia Preprocedure Evaluation (Signed)
Anesthesia Evaluation  Patient identified by MRN, date of birth, ID band Patient awake    Reviewed: Allergy & Precautions, NPO status , Patient's Chart, lab work & pertinent test results  Airway Mallampati: II  TM Distance: >3 FB Neck ROM: Full    Dental no notable dental hx. (+) Poor Dentition, Chipped   Pulmonary Current Smoker,    Pulmonary exam normal breath sounds clear to auscultation       Cardiovascular hypertension, Normal cardiovascular exam Rhythm:Regular Rate:Normal     Neuro/Psych negative neurological ROS  negative psych ROS   GI/Hepatic negative GI ROS, Neg liver ROS,   Endo/Other  negative endocrine ROS  Renal/GU negative Renal ROS  negative genitourinary   Musculoskeletal negative musculoskeletal ROS (+)   Abdominal   Peds negative pediatric ROS (+)  Hematology negative hematology ROS (+)   Anesthesia Other Findings   Reproductive/Obstetrics negative OB ROS                             Anesthesia Physical Anesthesia Plan  ASA: II  Anesthesia Plan: General   Post-op Pain Management:    Induction: Intravenous  PONV Risk Score and Plan: 1 and Ondansetron  Airway Management Planned: LMA  Additional Equipment:   Intra-op Plan:   Post-operative Plan: Extubation in OR  Informed Consent: I have reviewed the patients History and Physical, chart, labs and discussed the procedure including the risks, benefits and alternatives for the proposed anesthesia with the patient or authorized representative who has indicated his/her understanding and acceptance.   Dental advisory given  Plan Discussed with: CRNA  Anesthesia Plan Comments:         Anesthesia Quick Evaluation

## 2017-02-22 NOTE — Op Note (Signed)
Gregory Conley:  Conley, Gregory                 ACCOUNT NO.:  [id number]  MEDICAL RECORD NO.:  [id number]  LOCATION:                                 FACILITY:  PHYSICIAN:  Betha LoaKevin Chee Kinslow, MD             DATE OF BIRTH:  DATE OF PROCEDURE:  02/22/2017 DATE OF DISCHARGE:                              OPERATIVE REPORT   PREOPERATIVE DIAGNOSIS:  Right long finger mass.  POSTOPERATIVE DIAGNOSIS:  Right long finger inclusion cyst 5 mm.  PROCEDURE:  Right long finger excision mass, subfascial, measuring 5 mm in diameter.  SURGEON:  Betha LoaKevin Tarnisha Kachmar, MD.  ASSISTANT:  None.  ANESTHESIA:  General.  IV FLUIDS:  Per anesthesia flow sheet.  ESTIMATED BLOOD LOSS:  Minimal.  COMPLICATIONS:  None.  SPECIMENS:  Right long finger mass to Pathology.  TOURNIQUET TIME:  15 minutes.  DISPOSITION:  Stable to PACU.  INDICATIONS:  Mr. Gregory Conley is a 47 year old male, who has noted a mass in the right long finger.  It is bothersome to him.  It is at the DIP flexion crease.  He had a wound here at one point.  He wishes to have this removed.  Risks, benefits, and alternatives of the surgery were discussed including the risk of blood loss; infection; damage to nerves, vessels, tendons, ligaments, bone; failure of surgery; need for additional surgery; complications with wound healing; continued pain; and recurrence of mass.  He voiced understanding of these risks and elected to proceed.  OPERATIVE COURSE:  After being identified preoperatively by myself the patient, I agreed upon procedure and site of procedure.  Surgical site was marked.  Risks, benefits, and alternatives of surgery were reviewed and he wished to proceed.  Surgical consent had been signed.  He was given IV vancomycin as preoperative antibiotic prophylaxis.  He was transferred to the operating room and placed on the operating room table in supine position with the right upper extremity on arm board.  General anesthesia was induced by anesthesiologist.   Right upper extremity was prepped and draped in normal sterile orthopedic fashion.  Surgical pause was performed between the surgeons, anesthesia, and operating room staff and all were in agreement as to the patient, procedure, and site of procedure.  Tourniquet at the proximal aspect of the extremity was inflated to 250 mmHg after exsanguination of the limb with an Esmarch bandage.  Incision was made in a Brunner type fashion at the edge of the mass on the right long finger.  This was carried into subcutaneous tissues by spreading technique.  The mass was easily freed from the overlying skin.  It was pearly in coloration.  It was rounded.  It was carefully freed from its soft tissue attachments and removed in its entirety.  It measured 5 mm in diameter.  It was sent to Pathology for examination.  The wound was explored and no remaining mass was noted. The wound was copiously irrigated with sterile saline and closed with 4- 0 nylon in a horizontal mattress fashion.  The wound was then dressed with sterile Xeroform, 4x4s, and wrapped with a Coban dressing lightly. A digital block was performed with 0.25%  plain Marcaine to aid in postoperative analgesia.  Tourniquet was deflated at 15 minutes. Fingertips were pink with brisk capillary refill after deflation of tourniquet.  The operative drapes were broken down.  The patient was awoken from anesthesia safely.  He was transferred back to the stretcher and taken to PACU in stable condition.  I will see him back in the office in 1 week for postoperative followup.  I will give him Norco 5/325 one to two p.o. q.6 hours p.r.n. pain, dispensed #20.     Betha Loa, MD     KK/MEDQ  D:  02/22/2017  T:  02/22/2017  Job:  [id number]

## 2017-02-22 NOTE — H&P (Signed)
  Gregory Conley is an 47 y.o. male.   Chief Complaint: right long finger mass HPI: 47 yo male with mass right long finger.  Previous laceration in the area.  He wishes to have the mass removed.  Allergies:  Allergies  Allergen Reactions  . Azithromycin Anaphylaxis  . Erythromycin Anaphylaxis and Nausea And Vomiting  . Doxycycline   . Penicillins Nausea And Vomiting    REACTION: TOLERATES CEPHALOSPORINS  Has patient had a PCN reaction causing immediate rash, facial/tongue/throat swelling, SOB or lightheadedness with hypotension: n/a Has patient had a PCN reaction causing severe rash involving mucus membranes or skin necrosis: n/a Has patient had a PCN reaction that required hospitalization: n/a Has patient had a PCN reaction occurring within the last 10 years:  n/a If all of the above answers are "NO", then may proceed with Cephalosporin use.   . Sulfonamide Derivatives     Past Medical History:  Diagnosis Date  . Anxiety depression  . Gout   . Hypertension   . Lumbar compression fracture (HCC)    L5 blown , L1 and L2 bulging  . Meningitis, viral     Past Surgical History:  Procedure Laterality Date  . BUNIONECTOMY    . SHOULDER SURGERY Right 2014  . TONSILLECTOMY      Family History: Family History  Problem Relation Age of Onset  . COPD Mother   . Heart attack Paternal Grandfather     Social History:   reports that he has been smoking cigarettes.  He has a 30.00 pack-year smoking history. he has never used smokeless tobacco. He reports that he drinks alcohol. He reports that he uses drugs. Drug: Marijuana.  Medications: No medications prior to admission.    No results found for this or any previous visit (from the past 48 hour(s)).  No results found.   A comprehensive review of systems was negative.  Height 5\' 7"  (1.702 m), weight 61.2 kg (135 lb).  General appearance: alert, cooperative and appears stated age Head: Normocephalic, without obvious  abnormality, atraumatic Neck: supple, symmetrical, trachea midline Cardio: regular rate and rhythm Resp: clear to auscultation bilaterally Extremities: Intact sensation and capillary refill all digits.  +epl/fpl/io.  No wounds.  Pulses: 2+ and symmetric Skin: Skin color, texture, turgor normal. No rashes or lesions Neurologic: Grossly normal Incision/Wound:none  Assessment/Plan Right long finger mass.  Non operative and operative treatment options were discussed with the patient and patient wishes to proceed with operative treatment. Risks, benefits, and alternatives of surgery were discussed and the patient agrees with the plan of care.   Gregory Conley R 02/22/2017, 9:23 AM

## 2017-02-22 NOTE — Transfer of Care (Signed)
Immediate Anesthesia Transfer of Care Note  Patient: Gregory Conley  Procedure(s) Performed: EXCISION  RIGHT LONG FINGER MASS (Right Finger)  Patient Location: PACU  Anesthesia Type:General  Level of Consciousness: sedated  Airway & Oxygen Therapy: Patient Spontanous Breathing and Patient connected to face mask oxygen  Post-op Assessment: Report given to RN and Post -op Vital signs reviewed and stable  Post vital signs: Reviewed and stable  Last Vitals:  Vitals:   02/22/17 1200 02/22/17 1203  BP: 117/76   Pulse:  84  Resp:  17  Temp:    SpO2:  100%    Last Pain:  Vitals:   02/22/17 1015  TempSrc: Oral  PainSc: 5          Complications: No apparent anesthesia complications

## 2017-02-22 NOTE — Discharge Instructions (Addendum)

## 2017-02-22 NOTE — Anesthesia Procedure Notes (Signed)
Procedure Name: LMA Insertion Date/Time: 02/22/2017 11:25 AM Performed by: Damascus DesanctisLinka, Lelynd Poer L, CRNA Pre-anesthesia Checklist: Patient identified, Emergency Drugs available, Suction available, Patient being monitored and Timeout performed Patient Re-evaluated:Patient Re-evaluated prior to induction Oxygen Delivery Method: Circle system utilized Preoxygenation: Pre-oxygenation with 100% oxygen Induction Type: IV induction Ventilation: Mask ventilation without difficulty LMA: LMA inserted LMA Size: 4.0 Number of attempts: 1 Airway Equipment and Method: Bite block Placement Confirmation: positive ETCO2 Tube secured with: Tape Dental Injury: Teeth and Oropharynx as per pre-operative assessment

## 2017-02-22 NOTE — Brief Op Note (Signed)
02/22/2017  11:53 AM  PATIENT:  Gregory Conley  47 y.o. male  PRE-OPERATIVE DIAGNOSIS:  RIGHT LONG FINGER MASS  POST-OPERATIVE DIAGNOSIS:  RIGHT LONG FINGER MASS  PROCEDURE:  Procedure(s) with comments: EXCISION  RIGHT LONG FINGER MASS (Right) - right long finger  SURGEON:  Surgeon(s) and Role:    Betha Loa* Miyeko Mahlum, MD - Primary  PHYSICIAN ASSISTANT:   ASSISTANTS: none   ANESTHESIA:   general  EBL:  2 mL   BLOOD ADMINISTERED:none  DRAINS: none   LOCAL MEDICATIONS USED:  MARCAINE     SPECIMEN:  Source of Specimen:  right long finger  DISPOSITION OF SPECIMEN:  PATHOLOGY  COUNTS:  YES  TOURNIQUET:   Total Tourniquet Time Documented: Forearm (Right) - 15 minutes Total: Forearm (Right) - 15 minutes   DICTATION: .Other Dictation: Dictation Number [id number]  PLAN OF CARE: Discharge to home after PACU  PATIENT DISPOSITION:  PACU - hemodynamically stable.

## 2017-02-22 NOTE — Op Note (Signed)
[id number] 

## 2017-02-23 ENCOUNTER — Encounter (HOSPITAL_BASED_OUTPATIENT_CLINIC_OR_DEPARTMENT_OTHER): Payer: Self-pay | Admitting: Orthopedic Surgery

## 2017-02-23 NOTE — Anesthesia Postprocedure Evaluation (Signed)
Anesthesia Post Note  Patient: Gregory Conley  Procedure(s) Performed: EXCISION  RIGHT LONG FINGER MASS (Right Finger)     Patient location during evaluation: PACU Anesthesia Type: General Level of consciousness: awake and alert Pain management: pain level controlled Vital Signs Assessment: post-procedure vital signs reviewed and stable Respiratory status: spontaneous breathing, nonlabored ventilation, respiratory function stable and patient connected to nasal cannula oxygen Cardiovascular status: blood pressure returned to baseline and stable Postop Assessment: no apparent nausea or vomiting Anesthetic complications: no    Last Vitals:  Vitals:   02/22/17 1245 02/22/17 1328  BP: (!) 134/94 136/86  Pulse: 72 71  Resp: 19 18  Temp:  36.5 C  SpO2: 95% 97%    Last Pain:  Vitals:   02/22/17 1328  TempSrc: Oral  PainSc: 0-No pain                 Phillips Groutarignan, Markis Langland

## 2017-09-11 ENCOUNTER — Encounter (HOSPITAL_BASED_OUTPATIENT_CLINIC_OR_DEPARTMENT_OTHER): Payer: Self-pay

## 2017-09-11 ENCOUNTER — Emergency Department (HOSPITAL_COMMUNITY)
Admission: EM | Admit: 2017-09-11 | Discharge: 2017-09-11 | Disposition: A | Discharge status: Home / Self Care | Payer: 59 | Attending: Emergency Medicine | Admitting: Emergency Medicine

## 2017-09-11 ENCOUNTER — Other Ambulatory Visit: Payer: Self-pay

## 2017-09-11 ENCOUNTER — Encounter (HOSPITAL_COMMUNITY): Payer: Self-pay | Admitting: Emergency Medicine

## 2017-09-11 ENCOUNTER — Emergency Department (HOSPITAL_BASED_OUTPATIENT_CLINIC_OR_DEPARTMENT_OTHER): Payer: 59

## 2017-09-11 ENCOUNTER — Emergency Department (HOSPITAL_BASED_OUTPATIENT_CLINIC_OR_DEPARTMENT_OTHER)
Admission: EM | Admit: 2017-09-11 | Discharge: 2017-09-12 | Disposition: A | Discharge status: Home / Self Care | Payer: 59 | Source: Home / Self Care | Attending: Emergency Medicine | Admitting: Emergency Medicine

## 2017-09-11 DIAGNOSIS — Y929 Unspecified place or not applicable: Secondary | ICD-10-CM

## 2017-09-11 DIAGNOSIS — F17210 Nicotine dependence, cigarettes, uncomplicated: Secondary | ICD-10-CM | POA: Insufficient documentation

## 2017-09-11 DIAGNOSIS — Y939 Activity, unspecified: Secondary | ICD-10-CM | POA: Insufficient documentation

## 2017-09-11 DIAGNOSIS — Z79899 Other long term (current) drug therapy: Secondary | ICD-10-CM

## 2017-09-11 DIAGNOSIS — W2209XA Striking against other stationary object, initial encounter: Secondary | ICD-10-CM | POA: Diagnosis not present

## 2017-09-11 DIAGNOSIS — Y9389 Activity, other specified: Secondary | ICD-10-CM

## 2017-09-11 DIAGNOSIS — I10 Essential (primary) hypertension: Secondary | ICD-10-CM | POA: Insufficient documentation

## 2017-09-11 DIAGNOSIS — Y999 Unspecified external cause status: Secondary | ICD-10-CM | POA: Insufficient documentation

## 2017-09-11 DIAGNOSIS — S0101XA Laceration without foreign body of scalp, initial encounter: Secondary | ICD-10-CM

## 2017-09-11 DIAGNOSIS — Z5321 Procedure and treatment not carried out due to patient leaving prior to being seen by health care provider: Secondary | ICD-10-CM | POA: Diagnosis not present

## 2017-09-11 DIAGNOSIS — W228XXA Striking against or struck by other objects, initial encounter: Secondary | ICD-10-CM

## 2017-09-11 MED ORDER — LIDOCAINE-EPINEPHRINE-TETRACAINE (LET) SOLUTION
3.0000 mL | Freq: Once | NASAL | Status: AC
  Administered 2017-09-11: 3 mL via TOPICAL
  Filled 2017-09-11: qty 3

## 2017-09-11 MED ORDER — TETANUS-DIPHTH-ACELL PERTUSSIS 5-2.5-18.5 LF-MCG/0.5 IM SUSP
0.5000 mL | Freq: Once | INTRAMUSCULAR | Status: DC
  Filled 2017-09-11: qty 0.5
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
## 2017-09-11 NOTE — ED Notes (Signed)
Pt LWBS, pt laid stickers on desk and walked out of ER

## 2017-09-11 NOTE — ED Notes (Signed)
Unable to locate pt. by nurse and CT tech at triage and waiting area .
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
## 2017-09-12 ENCOUNTER — Encounter (HOSPITAL_BASED_OUTPATIENT_CLINIC_OR_DEPARTMENT_OTHER): Payer: Self-pay | Admitting: Emergency Medicine
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
## 2017-10-05 ENCOUNTER — Encounter: Payer: Self-pay | Admitting: *Deleted

## 2017-10-12 ENCOUNTER — Ambulatory Visit: Payer: 59 | Admitting: Psychiatry

## 2017-10-12 DIAGNOSIS — F331 Major depressive disorder, recurrent, moderate: Secondary | ICD-10-CM
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
## 2017-10-19 ENCOUNTER — Ambulatory Visit: Payer: Self-pay | Admitting: Physician Assistant

## 2017-10-19 ENCOUNTER — Ambulatory Visit: Payer: Self-pay | Admitting: Psychiatry

## 2017-10-26 ENCOUNTER — Ambulatory Visit: Payer: 59 | Admitting: Psychiatry

## 2017-10-26 DIAGNOSIS — F331 Major depressive disorder, recurrent, moderate: Secondary | ICD-10-CM | POA: Diagnosis not present
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
## 2017-10-29 ENCOUNTER — Ambulatory Visit: Payer: 59 | Admitting: Physician Assistant

## 2017-10-29 ENCOUNTER — Encounter: Payer: Self-pay | Admitting: Physician Assistant

## 2017-10-29 VITALS — BP 145/84 | HR 72

## 2017-10-29 DIAGNOSIS — F331 Major depressive disorder, recurrent, moderate: Secondary | ICD-10-CM | POA: Diagnosis not present

## 2017-10-29 DIAGNOSIS — G4700 Insomnia, unspecified: Secondary | ICD-10-CM | POA: Diagnosis not present

## 2017-10-29 DIAGNOSIS — F4310 Post-traumatic stress disorder, unspecified: Secondary | ICD-10-CM | POA: Diagnosis not present

## 2017-10-29 DIAGNOSIS — F411 Generalized anxiety disorder: Secondary | ICD-10-CM | POA: Diagnosis not present

## 2017-10-29 MED ORDER — BUPROPION HCL ER (XL) 150 MG PO TB24: 150.0000 mg | ORAL_TABLET | Freq: Every day | ORAL | 3 refills | Status: DC

## 2017-10-29 MED ORDER — DULOXETINE HCL 60 MG PO CPEP: ORAL_CAPSULE | ORAL | 3 refills | Status: DC

## 2017-10-29 MED ORDER — CARBAMAZEPINE 200 MG PO TABS: 200.0000 mg | ORAL_TABLET | Freq: Three times a day (TID) | ORAL | 3 refills | Status: DC

## 2017-10-29 MED ORDER — PRAZOSIN HCL 1 MG PO CAPS: 1.0000 mg | ORAL_CAPSULE | Freq: Every day | ORAL | 3 refills | Status: DC

## 2017-10-29 MED ORDER — TRAZODONE HCL 100 MG PO TABS: ORAL_TABLET | ORAL | 3 refills | Status: DC
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
## 2017-11-02 ENCOUNTER — Ambulatory Visit: Payer: 59 | Admitting: Psychiatry

## 2017-11-02 DIAGNOSIS — F331 Major depressive disorder, recurrent, moderate: Secondary | ICD-10-CM

## 2017-11-02 NOTE — Progress Notes (Signed)
      [facility] Counselor/Therapist Progress Note   Patient ID: Gregory Conley, MRN: [id number]  Date: 11/02/2017  Timespent: 45 minutes  Treatment Type: Individual  Subjective:   Patient in today with some depressed mood, angry, and fatigued.  Anxious about upcoming court and angry about some rumors he has heard in reference to possible plea deals.  Calmed down quickly and continued processing his emotions in reference to court.  Worked with patient on some modulation of emotion especially since court is coming up this Monday. Emphasized the progress patient has made and discussed strategies for managing stress and anger, while encouraging  minimal drinking in the meantime. Calmer upon leaving.  Will see patient next week for follow up.  Interventions:Solution Focused, Strength-based, Supportive and Reframing  Mental Status Exam:   Appearance:   Casual     Behavior:  Appropriate and Sharing  Motor:  Normal  Speech/Language:   Normal Rate  Affect:  Depressed and Flat  Mood:  angry and depressed  Thought process:  Relevant  Thought content:    Logical  Perceptual disturbances:    Normal  Orientation:  Full (Time, Place, and Person)  Attention:  Good  Concentration:  difficulty with focus and attention and good  Memory:  Immediate  Fund of knowledge:   Good  Insight:    Fair  Judgment:   Good  Impulse Control:  fair    Reported Symptoms: Concerned about upcoming court, depressed mood, fatigue, some anxiety, angry  Risk Assessment: Danger to Self:  No Self-injurious Behavior: No Danger to Others: No Duty to Warn:no Physical Aggression / Violence:No  Access to Firearms a concern: No  Gang Involvement:No   Diagnosis:   ICD-10-CM   1. Major depressive disorder, recurrent episode, moderate (HCC) F33.1      Plan: Plan to see patient within 1-2 weeks to continue goal-directed treatment.  [name], LCSW

## 2017-11-09 ENCOUNTER — Ambulatory Visit: Payer: 59 | Admitting: Psychiatry

## 2017-11-09 DIAGNOSIS — F331 Major depressive disorder, recurrent, moderate: Secondary | ICD-10-CM | POA: Diagnosis not present
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
## 2017-11-16 ENCOUNTER — Ambulatory Visit: Payer: 59 | Admitting: Psychiatry

## 2017-11-16 DIAGNOSIS — F331 Major depressive disorder, recurrent, moderate: Secondary | ICD-10-CM

## 2017-11-16 NOTE — Progress Notes (Signed)
      [facility] Counselor/Therapist Progress Note   Patient ID: Gregory Conley, MRN: [id number]  Date: 11/16/2017  Timespent:  48 minutes   Treatment Type: Individual   Reported Symptoms: anxiety, some depression,   Mental Status Exam:    Appearance:   Casual     Behavior:  Appropriate and Sharing  Motor:  Normal  Speech/Language:   Normal Rate  Affect:  Congruent  Mood:  anxious  Thought process:  normal  Thought content:    WNL  Sensory/Perceptual disturbances:    WNL  Orientation:  oriented to person, place, time/date, situation, day of week, month of year and year  Attention:  Good  Concentration:  Good  Memory:  WNL  Fund of knowledge:   Good  Insight:    Good  Judgment:   Good  Impulse Control:  Good     Risk Assessment: Danger to Self:  No Self-injurious Behavior: No Danger to Others: No Duty to Warn:no Physical Aggression / Violence:No  Access to Firearms a concern: No  Gang Involvement:No    Subjective:  Patient in today with symptoms of anxiety and "some depression but mostly anxiety".  Doing well on his meds per his report. Things have been "quiet" since court date recently.  Discussed some issues from work that we were able to talk through.  Important for him to recognize when and how to say something and when to refrain from adding to a conversation.  Tendency to sometimes assume something will go in negative direction versus positive.   Does seem to be doing better. .  Discussed his previous goals and worked on updating them today.     Interventions: Solution-Oriented/Positive Psychology, Ego-Supportive and Insight-Oriented   Diagnosis:   ICD-10-CM   1. Major depressive disorder, recurrent episode, moderate (HCC) F33.1      Plan:  Plan to see patient in 1-2 weeks to continue goal-directed treatment.   GOALS:   1).  Continue to progress in the areas of self-control, taking meds as prescribed, and avoiding peopl that patient knows are  unhealthy for him. 2)  Make good decisions in everyday life, choosing to look for what might go right versus wrong,     [name], LCSW

## 2017-11-23 ENCOUNTER — Ambulatory Visit: Payer: 59 | Admitting: Psychiatry

## 2017-11-23 DIAGNOSIS — F331 Major depressive disorder, recurrent, moderate: Secondary | ICD-10-CM

## 2017-11-23 NOTE — Progress Notes (Signed)
      [facility] Counselor/Therapist Progress Note   Patient ID: Gregory Conley, MRN: [id number]  Date: 11/23/2017  Timespent: 48 minutes   Treatment Type: Individual   Reported Symptoms: Fatigue and anxiety, anger, stress   Mental Status Exam:    Appearance:   Casual     Behavior:  Appropriate and Sharing  Motor:  Normal  Speech/Language:   Normal Rate  Affect:  Blunt  Mood:  angry and anxious  Thought process:  normal  Thought content:    WNL  Sensory/Perceptual disturbances:    WNL  Orientation:  oriented to person, place, time/date, situation, day of week, month of year and year  Attention:  Good  Concentration:  Good  Memory:  WNL  Fund of knowledge:   Good  Insight:    Fair  Judgment:   Good  Impulse Control:  Fair     Risk Assessment: Danger to Self:  No Self-injurious Behavior: No Danger to Others: No Duty to Warn:no Physical Aggression / Violence:No  Access to Firearms a concern: No  Gang Involvement:No    Subjective:  Patient in today reporting anxiety, stress, fatigue, and anger.  Still having a lot of leftover feelings from incidents that led to court and we discussed today.  Used some of our time today to process this and his mood did seem to level-out some. Reports he is continuing to do well at work, more reliable, although "it drives me crazy sometimes".  "I get pulled in multiple directions by people".  States"setting limits or other strategies would not work because they are used to me doing everything I'm told and to get extra money".   Interventions: Solution-Oriented/Positive Psychology and Insight-Oriented   Diagnosis:   ICD-10-CM   1. Major depressive disorder, recurrent episode, moderate (HCC) F33.1      Plan: Patient to think more about setting some limits so he is not working every day of the week. Commits that he will avoid confrontation with people involve in recent court case.   Mathis Fareeborah Adalie Mand,  LCSW

## 2017-11-28 ENCOUNTER — Encounter: Payer: Self-pay | Admitting: Emergency Medicine

## 2017-11-28 DIAGNOSIS — F4310 Post-traumatic stress disorder, unspecified: Secondary | ICD-10-CM | POA: Insufficient documentation

## 2017-11-28 DIAGNOSIS — F411 Generalized anxiety disorder: Secondary | ICD-10-CM | POA: Insufficient documentation

## 2017-11-30 ENCOUNTER — Ambulatory Visit: Payer: 59 | Admitting: Psychiatry

## 2017-11-30 DIAGNOSIS — F4310 Post-traumatic stress disorder, unspecified: Secondary | ICD-10-CM

## 2017-11-30 NOTE — Progress Notes (Signed)
      [facility] Counselor/Therapist Progress Note  Patient ID: Learta Coddingric G Hailu, MRN: [id number],    Date: 11/30/2017  Time Spent: 48 minutes  Treatment Type: Individual Therapy  Reported Symptoms: Depressed mood, Anxious Mood, Irritability and Fatigue  Mental Status Exam:  Appearance:   Casual     Behavior:  Appropriate and Sharing  Motor:  Normal  Speech/Language:   Normal Rate  Affect:  Congruent  Mood:  anxious and depressed  Thought process:  normal  Thought content:    WNL  Sensory/Perceptual disturbances:    WNL  Orientation:  oriented to person, place, time/date, situation, day of week, month of year and year  Attention:  Good  Concentration:  Good  Memory:  WNL  Fund of knowledge:   Good  Insight:    Fair  Judgment:   Good  Impulse Control:  Fair   Risk Assessment: Danger to Self:  No Self-injurious Behavior: No Danger to Others: No Duty to Warn:no Physical Aggression / Violence:No  Access to Firearms a concern: No  Gang Involvement:No   Subjective:   Patient in today reporting symptoms of anxiety, irritability, some depression, and fatigue.  Sometimes will be hypervigilant when seeing people that reminds him of bad experiences in past.  Did make a good decision once this past week when he saw an individual where there's been some bad issues in the past---patient chose not to go anyway near the person and actually left the scene. Does a good job today venting frustrations versus letting them get him "worked up".  Had some initial irritability but relatively calm as he talks through issues. Discussed strategies for dealing with conflict or others that try to engage him in conflict.    Interventions: Solution-Oriented/Positive Psychology and Ego-Supportive  Diagnosis:   ICD-10-CM   1. PTSD (post-traumatic stress disorder) F43.10     Plan:  Patient to continue using good judgment in reaction to people and situations. Work to have good self-control to where he  demonstrates the strength to walk away if/when others try to be threatening towards him.  Reviewed prior discussions on staying in the present and not reverting back to the past nor jumping too far ahead.  Patient report feeling a bit more optimistic that he can manage these situations better and be calmer overall.      Mathis Fareeborah Anoop Hemmer, LCSW

## 2017-12-13 ENCOUNTER — Ambulatory Visit: Payer: 59 | Admitting: Physician Assistant

## 2017-12-14 ENCOUNTER — Ambulatory Visit: Payer: 59 | Admitting: Psychiatry

## 2017-12-21 ENCOUNTER — Ambulatory Visit: Payer: 59 | Admitting: Psychiatry

## 2017-12-28 ENCOUNTER — Ambulatory Visit: Payer: 59 | Admitting: Psychiatry

## 2017-12-28 DIAGNOSIS — F4310 Post-traumatic stress disorder, unspecified: Secondary | ICD-10-CM

## 2017-12-28 NOTE — Progress Notes (Signed)
      [facility] Counselor/Therapist Progress Note  Patient ID: Gregory Conley, MRN: [id number],    Date: 12/28/2017  Time Spent: 45 minutes  Treatment Type: Individual Therapy  Reported Symptoms:  Depressed, fatigue, mad, anxiety,  Mental Status Exam:  Appearance:   Casual     Behavior:  Appropriate and Sharing  Motor:  Normal  Speech/Language:   Normal Rate  Affect:  irritable, some depressed mood  Mood:  some depressed mood and anxiety  Thought process:  normal  Thought content:    WNL  Sensory/Perceptual disturbances:    WNL  Orientation:  oriented to person, place, time/date, situation, day of week, month of year and year  Attention:  Fair  Concentration:  Fair  Memory:  Reports recent and immediate memory "forgetfulness"  Fund of knowledge:   Good  Insight:    Fair  Judgment:   Fair  Impulse Control:  Fair   Risk Assessment: Danger to Self:  No Self-injurious Behavior: No Danger to Others: No Duty to Warn:no Physical Aggression / Violence:No  Access to Firearms a concern: No  Gang Involvement:No   Subjective:   Patient in today reporting symptoms listed above.  States that he is receiving messages on phone from someone who he thinks is person that he recently had interpersonal conflicts with and had to go to court. Openly shared his frustrations about these contacts but was finally able to realize that one thing he could do to affect it, is to stop responding to the messages being sent.  After processing this for a while, he was noticeably calmer and able to talk about additional things.  Talked about strategies to manage his anxiety and depressed mood, although his mood improved some before session had ended.  Encouraged him to follow through with his plan to be around some coworkers over the holidays----people that care about him and are supportive of him.    Interventions: Solution-Oriented/Positive Psychology and Ego-Supportive  Diagnosis:   ICD-10-CM   1.  PTSD (post-traumatic stress disorder) F43.10     Plan:  Patient to follow through with some strategies discussed today to help diffuse anger and not respond to aggravating messages on his phone.  Working on Letting Go in order to move forward.  Will see again within 2 wks for continued goal-directed treatment.  Gregory Fareeborah Adasyn Mcadams, LCSW

## 2018-01-04 ENCOUNTER — Ambulatory Visit: Payer: 59 | Admitting: Psychiatry

## 2018-01-11 ENCOUNTER — Ambulatory Visit: Payer: 59 | Admitting: Psychiatry

## 2018-01-25 ENCOUNTER — Ambulatory Visit: Payer: 59 | Admitting: Psychiatry

## 2018-01-25 DIAGNOSIS — F4310 Post-traumatic stress disorder, unspecified: Secondary | ICD-10-CM | POA: Diagnosis not present

## 2018-01-25 NOTE — Progress Notes (Signed)
      [facility] Counselor/Therapist Progress Note  Patient ID: Gregory Conley, MRN: [id number],    Date: 01/25/2018  Time Spent:   Treatment Type: Individual Therapy  Reported Symptoms: some anxiety, depression, stress, frustration  Mental Status Exam:  Appearance:   Casual     Behavior:  Appropriate and Sharing  Motor:  Normal  Speech/Language:   Clear and Coherent  Affect:  Congruent  Mood:  euthymic  Thought process:  normal  Thought content:    WNL  Sensory/Perceptual disturbances:    WNL  Orientation:  oriented to person, place, time/date, situation, day of week, month of year and year  Attention:  Good  Concentration:  Good  Memory:  WNL  Fund of knowledge:   Good  Insight:    Fair  Judgment:   Fair  Impulse Control:  Fair   Risk Assessment: Danger to Self:  No Self-injurious Behavior: No Danger to Others: No Duty to Warn:no Physical Aggression / Violence:No  Access to Firearms a concern: No  Gang Involvement:No   Subjective:  Patient in today reporting symtoms noted above.  Work has been quite busy and growing including a lot of overtime with extra pay. Had to miss a few appts due to working hours as business has been growing.  Seems to be more stable. Euthymic, and seems to be managing stress better.  Attitude good. Holidays were mixed with sadness, realizing again that "mom and dad being dead, things will never be the same again".  (mom died 2 yrs ago and dad in [id number]) Talked about this more in session and strategies offered that could help patient in coping with past complicated grief issues and agree to follow up more next visit.  Reports he does better at "non-holiday" times.   Interventions: Solution-Oriented/Positive Psychology and Ego-Supportive  Diagnosis:   ICD-10-CM   1. PTSD (post-traumatic stress disorder) F43.10     Plan: Patient to follow through on strategies offered to him in session today and also encouraged to remain on meds as prescribed as  he has been better recently, more calm and better focused at work as well.  To see again within 1-2 wks. Mathis Fareeborah Deidrea Gaetz, LCSW

## 2018-01-31 ENCOUNTER — Ambulatory Visit: Payer: 59 | Admitting: Physician Assistant

## 2018-02-01 ENCOUNTER — Ambulatory Visit: Payer: 59 | Admitting: Psychiatry

## 2018-02-01 DIAGNOSIS — F4310 Post-traumatic stress disorder, unspecified: Secondary | ICD-10-CM

## 2018-02-01 NOTE — Progress Notes (Signed)
      [facility] Counselor/Therapist Progress Note  Patient ID: Gregory Conley, MRN: [id number],    Date: 02/01/2018  Time Spent: 50 minutes  Treatment Type: Individual Therapy  Reported Symptoms:  Fatigue, anxiety,   Mental Status Exam:  Appearance:   Casual     Behavior:  Appropriate and Sharing  Motor:  Normal  Speech/Language:   Normal Rate  Affect:  Congruent  Mood:  anxious  Thought process:  normal  Thought content:    WNL  Sensory/Perceptual disturbances:    WNL  Orientation:  oriented to person, place, time/date, situation, day of week, month of year and year  Attention:  Fair  Concentration:  Fair  Memory:  WNL  Fund of knowledge:   Good  Insight:    Fair  Judgment:   Fair  Impulse Control:  Fair   Risk Assessment: Danger to Self:  No Self-injurious Behavior: No Danger to Others: No Duty to Warn:no Physical Aggression / Violence:No  Access to Firearms a concern: No  Gang Involvement:No   Subjective:  Patient in today with some anxiety and some depressed mood.  Work continues to be very busy with lots of opportunities for overtime.  Has done better at work with no outbursts anytime recently. 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  Adult son still living with him and has remained "clean".  Son was given a job at same company where patient works.  Fatigue has been more of an issue recently but he's worked more overtime.  Encouraged him to get better sleep even if he needs to cut back on some of the overtime.   Interventions: Solution-Oriented/Positive Psychology and Ego-Supportive  Diagnosis:   ICD-10-CM   1. PTSD (post-traumatic stress disorder) F43.10     Plan:  Encouraged patient to take seriously his need for more adequate sleep and to practice better over-all self-care.   Will see again with 2 wks.  [name], LCSW     l

## 2018-02-08 ENCOUNTER — Ambulatory Visit: Payer: 59 | Admitting: Psychiatry

## 2018-02-08 DIAGNOSIS — F4310 Post-traumatic stress disorder, unspecified: Secondary | ICD-10-CM

## 2018-02-08 NOTE — Progress Notes (Signed)
      [facility] Counselor/Therapist Progress Note  Patient ID: Gregory Conley, MRN: [id number],    Date: 02/08/2018  Time Spent: 45 minutes  Treatment Type: Individual Therapy  Reported Symptoms:  Fatigue, anxiety, (but  Improved)  Mental Status Exam:  Appearance:   Casual     Behavior:  Appropriate and Sharing  Motor:  Normal  Speech/Language:   Normal Rate  Affect:  Congruent  Mood:  anxious  Thought process:  normal  Thought content:    WNL  Sensory/Perceptual disturbances:    WNL  Orientation:  oriented to person, place, time/date, situation, day of week, month of year and year  Attention:  Fair  Concentration:  Fair  Memory:  WNL  Fund of knowledge:   Good  Insight:    Fair  Judgment:   Fair  Impulse Control:  Fair   Risk Assessment: Danger to Self:  No Self-injurious Behavior: No Danger to Others: No Duty to Warn:no Physical Aggression / Violence:No  Access to Firearms a concern: No  Gang Involvement:No   Subjective:  Patient in today with some anxiety and fatigue. Symptoms have improved and not much depression at this point.  Work continues to be very busy but he thinks may get back to normal after February.  Has done better at work with no outbursts anytime recently. Supervisor continues to entrust him with lots of extra job-related tasks and that has made him feel better at work.  Adult son continues to live with him and has remained "clean".  Son was given a job at same company where patient works.  Fatigue remains an issue and he explains it is due to overtime hours.  Encouraged him to get better sleep even if he needs to cut back on some of the overtime which he says is his choice.   Interventions: Solution-Oriented/Positive Psychology and Ego-Supportive  Diagnosis:   ICD-10-CM   1. PTSD (post-traumatic stress disorder) F43.10     Plan:  Encouraged patient to take seriously his need for more adequate sleep and to practice better over-all self-care.   Due  to progress, we mutually decided to stretch out his next appt to 3 weeks.  Continue on meds as prescribed.    [name], LCSW     l

## 2018-03-01 ENCOUNTER — Ambulatory Visit: Payer: 59 | Admitting: Psychiatry

## 2018-03-01 DIAGNOSIS — F411 Generalized anxiety disorder: Secondary | ICD-10-CM

## 2018-03-01 NOTE — Progress Notes (Signed)
      [facility] Counselor/Therapist Progress Note  Patient ID: Gregory Conley, MRN: [id number],    Date: 03/01/2018  Time Spent: 35  minutes  Treatment Type: Individual Therapy  Reported Symptoms:  Fatigue, anxiety, (but  Improved)  Mental Status Exam:  Appearance:   Casual     Behavior:  Appropriate and Sharing  Motor:  Normal  Speech/Language:   Normal Rate  Affect:  Congruent  Mood:  anxious  Thought process:  normal  Thought content:    WNL  Sensory/Perceptual disturbances:    WNL  Orientation:  oriented to person, place, time/date, situation, day of week, month of year and year  Attention:  Fair  Concentration:  Fair  Memory:  WNL  Fund of knowledge:   Good  Insight:    Fair  Judgment:   Fair  Impulse Control:  Fair   Risk Assessment: Danger to Self:  No Self-injurious Behavior: No Danger to Others: No Duty to Warn:no Physical Aggression / Violence:No  Access to Firearms a concern: No  Gang Involvement:No   Subjective:  ( Appt. Started later due doPatient in today with some anxiety and fatigue. Still working some overtime, but that has slowed down for now.  A recent personnel interaction that bothered him and we discussed today and he seemed to be able to move forward.  To be "off" this weekend and plans to get together with friends and play golf.  Symptoms have improved and not much depression at this point. Anxiety had peaked more recently due to work and personnel stressors.  Seemed to have a better outlook upon leaving today.  Interventions: Solution-Oriented/Positive Psychology and Ego-Supportive  Diagnosis:   ICD-10-CM   1. Generalized anxiety disorder F41.1     Plan:  Encouraged patient to take seriously his need for more adequate sleep and to practice better over-all self-care.   Due to progress, we mutually decided to stretch out his next appt to 3 weeks.  Continue on meds as prescribed.    [name], LCSW     l

## 2018-03-22 ENCOUNTER — Ambulatory Visit: Payer: 59 | Admitting: Psychiatry

## 2018-03-22 ENCOUNTER — Other Ambulatory Visit: Payer: Self-pay

## 2018-03-22 ENCOUNTER — Telehealth: Payer: Self-pay | Admitting: Physician Assistant

## 2018-03-22 DIAGNOSIS — F4310 Post-traumatic stress disorder, unspecified: Secondary | ICD-10-CM

## 2018-03-22 MED ORDER — BUPROPION HCL ER (XL) 150 MG PO TB24: 150.0000 mg | ORAL_TABLET | Freq: Every day | ORAL | 0 refills | Status: DC

## 2018-03-22 MED ORDER — PRAZOSIN HCL 1 MG PO CAPS: 1.0000 mg | ORAL_CAPSULE | Freq: Every day | ORAL | 0 refills | Status: DC

## 2018-03-22 MED ORDER — CARBAMAZEPINE 200 MG PO TABS: 200.0000 mg | ORAL_TABLET | Freq: Three times a day (TID) | ORAL | 0 refills | Status: DC

## 2018-03-22 NOTE — Progress Notes (Signed)
      [facility] Counselor/Therapist Progress Note  Patient ID: Gregory Conley, MRN: [id number],    Date: 03/22/2018  Time Spent: 60 minutes  Treatment Type: Individual Therapy  Reported Symptoms:  Fatigue, anxiety, (but  Improved)  Mental Status Exam:  Appearance:   Casual     Behavior:  Appropriate and Sharing  Motor:  Normal  Speech/Language:   Normal Rate  Affect:  Congruent  Mood:  anxious  Thought process:  normal  Thought content:    WNL  Sensory/Perceptual disturbances:    WNL  Orientation:  oriented to person, place, time/date, situation, day of week, month of year and year  Attention:  Fair  Concentration:  Fair  Memory:  WNL  Fund of knowledge:   Good  Insight:    Fair  Judgment:   Fair  Impulse Control:  Fair   Risk Assessment: Danger to Self:  No Self-injurious Behavior: No Danger to Others: No Duty to Warn:no Physical Aggression / Violence:No  Access to Firearms a concern: No  Gang Involvement:No   Subjective:   Patient in today and has been doing better with his anxiety and no PTSD symptoms since last appt.  Still fatigued and some anxiety.  But feels like he is "still better".  Does share that he is running low on his meds, "not sure if he missed an appt or not".  Adds that he cut back on meds a bit when he realized he was running low and is about to run out at this point.  I told him we will check with the nurse today re: his meds.  He is scheduled to see his med provider Melony Overly, [facility] next Thursday at 10a.m.   Still working some overtime, but that has slowed down for now.  Reports prior personnel interaction that had bothered him is not an issue anymore. Reports depression in not really "bothering him anymore".   Is playing golf in his off time which is good for him.  Still has his adult son living with him and that continues to go well.    Interventions: Solution-Oriented/Positive Psychology and Ego-Supportive  Diagnosis:   ICD-10-CM   1. PTSD  (post-traumatic stress disorder) F43.10     Plan:    Continue on meds as prescribed.  Per note above, nurse was not onsite when we finished our appt so he will call Monday, March 16 about meds.   To continue healthier habits and strategies for managing anxiety.  Will see again in approx. 3 wks.  Gregory Fare, LCSW     l

## 2018-03-22 NOTE — Telephone Encounter (Signed)
Submitted to [facility] per request 30 day has office visit 03/19

## 2018-03-22 NOTE — Telephone Encounter (Signed)
Patient was in today to see debbie. He needs refills on his wellbutrin 150 mg , prazosin 1 mg and his carbamazepine 200 mg esceibed to [facility] on [address]. His next appointment is Thursday, marrch 19th. He is out of all these meds.

## 2018-03-28 ENCOUNTER — Ambulatory Visit: Payer: 59 | Admitting: Physician Assistant

## 2018-04-08 ENCOUNTER — Ambulatory Visit (INDEPENDENT_AMBULATORY_CARE_PROVIDER_SITE_OTHER): Payer: 59 | Admitting: Physician Assistant

## 2018-04-08 ENCOUNTER — Encounter: Payer: Self-pay | Admitting: Physician Assistant

## 2018-04-08 ENCOUNTER — Other Ambulatory Visit: Payer: Self-pay

## 2018-04-08 DIAGNOSIS — F4310 Post-traumatic stress disorder, unspecified: Secondary | ICD-10-CM | POA: Diagnosis not present

## 2018-04-08 DIAGNOSIS — F411 Generalized anxiety disorder: Secondary | ICD-10-CM | POA: Diagnosis not present

## 2018-04-08 DIAGNOSIS — G4700 Insomnia, unspecified: Secondary | ICD-10-CM

## 2018-04-08 DIAGNOSIS — F331 Major depressive disorder, recurrent, moderate: Secondary | ICD-10-CM | POA: Diagnosis not present

## 2018-04-08 MED ORDER — BUPROPION HCL ER (XL) 150 MG PO TB24: 150.0000 mg | ORAL_TABLET | Freq: Every day | ORAL | 1 refills | Status: DC

## 2018-04-08 NOTE — Progress Notes (Signed)
[facility] Med Check  Patient ID: Gregory Conley,  MRN: [id number]  PCP: System, Pcp Not In  Date of Evaluation: 04/08/2018 Time spent:15 minutes  Chief Complaint:  Chief Complaint    Follow-up     Virtual Visit via Telephone Note  I connected with Gregory Conley on 04/08/18 at  9:30 AM EDT by telephone and verified that I am speaking with the correct person using two identifiers.   I discussed the limitations, risks, security and privacy concerns of performing an evaluation and management service by telephone and the availability of in person appointments. I also discussed with the patient that there may be a patient responsible charge related to this service. The patient expressed understanding and agreed to proceed.    HISTORY/CURRENT STATUS: HPI For routine 6 month med check.  He was feeling like a zombie so he decreased the CBZ. And he stopped Cymbalta and the Prazosin.  He is not needing the trazodone very often at all but he does have if he does need it.  He is still taking the Wellbutrin.  Denies flashbacks or nightmares.  States he feels really good.  "Better than I have felt in a long time." Patient denies loss of interest in usual activities and is able to enjoy things.  Denies decreased energy or motivation.  Appetite has not changed.  No extreme sadness, tearfulness, or feelings of hopelessness.  Denies any changes in concentration, making decisions or remembering things.  Denies suicidal or homicidal thoughts.  Anxiety is very well controlled.  He still smokes which helps that and states he is not ready to quit.  He sleeps well most of the time but does have nights when he does not sleep as well.  He is trying not to take the trazodone.  He practices good sleep hygiene which usually helps.  Work is going well.  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  They just do not have enough supplies.   His job is secure at this point and he has no concerns financially.  He feels like he had coronavirus couple of months ago.  He had shortness of breath, fever, and cough.  States he went to the doctor several times and was given prednisone, an antibiotic, and breathing treatments and it finally improved.  "I think I am fine and do not have to worry about coronavirus at this point."  Denies muscle or joint pain, stiffness, or dystonia.  Denies dizziness, syncope, seizures, numbness, tingling, tremor, tics, unsteady gait, slurred speech, confusion.   Individual Medical History/ Review of Systems: Changes? :No    Past medications for mental health diagnoses include: Cymbalta, Xanax, Zoloft, BuSpar, Wellbutrin, trazodone, carbamazepine  Allergies: Azithromycin; Erythromycin; Doxycycline; Penicillins; and Sulfonamide derivatives  Current Medications:  Current Outpatient Medications:  .  buPROPion (WELLBUTRIN XL) 150 MG 24 hr tablet, Take 1 tablet (150 mg total) by mouth daily., Disp: 90 tablet, Rfl: 1 .  carbamazepine (TEGRETOL) 200 MG tablet, Take 1 tablet (200 mg total) by mouth 3 (three) times daily. (Patient taking differently: Take 200 mg by mouth daily. ), Disp: 90 tablet, Rfl: 0 .  cephALEXin (KEFLEX) 500 MG capsule, Take 1 capsule (500 mg total) by mouth 4 (four) times daily. (Patient not taking: Reported on 10/29/2017), Disp: 40 capsule, Rfl: 0 .  HYDROcodone-acetaminophen (NORCO) 5-325 MG tablet, 1-2 tabs po q6 hours prn pain (Patient not taking: Reported on 04/08/2018), Disp:  20 tablet, Rfl: 0 .  ibuprofen (ADVIL,MOTRIN) 600 MG tablet, Take 1 tablet (600 mg total) by mouth every 6 (six) hours as needed. (Patient not taking: Reported on 04/08/2018), Disp: 30 tablet, Rfl: 0 .  ondansetron (ZOFRAN ODT) 4 MG disintegrating tablet, Take 1 tablet (4 mg total) by mouth every 8 (eight) hours as needed for nausea or vomiting. (Patient not taking: Reported on 04/08/2018), Disp: 20 tablet, Rfl: 0 .   traZODone (DESYREL) 100 MG tablet, 1-2 qhs prn (Patient not taking: Reported on 04/08/2018), Disp: 60 tablet, Rfl: 3 Medication Side Effects: none  Family Medical/ Social History: Changes? No  MENTAL HEALTH EXAM:  There were no vitals taken for this visit.There is no height or weight on file to calculate BMI.  General Appearance: Phone visit, unable to assess  Eye Contact:  Phone visit unable to assess  Speech:  Clear and Coherent  Volume:  Normal  Mood:  Euthymic  Affect:  Phone visit unable to assess  Thought Process:  Goal Directed  Orientation:  Full (Time, Place, and Person)  Thought Content: Logical   Suicidal Thoughts:  No  Homicidal Thoughts:  No  Memory:  WNL  Judgement:  Good  Insight:  Good  Psychomotor Activity:  Unable to assess  Concentration:  Concentration: Good  Recall:  Good  Fund of Knowledge: Good  Language: Good  Assets:  Desire for Improvement  ADL's:  Intact  Cognition: WNL  Prognosis:  Good    DIAGNOSES:    ICD-10-CM   1. Major depressive disorder, recurrent episode, moderate (HCC) F33.1   2. Generalized anxiety disorder F41.1   3. Insomnia, unspecified type G47.00   4. PTSD (post-traumatic stress disorder) F43.10     Receiving Psychotherapy: Yes With Rockne Menghini, LCSW   RECOMMENDATIONS: Continue Wellbutrin XL 150 mg p.o. every morning. Continue carbamazepine 200 mg p.o. 1 daily. Continue trazodone 100 mg 1-2 nightly as needed sleep. Continue to practice good sleep hygiene. Continue psychotherapy with Rockne Menghini, LCSW. Return in 6 months or sooner as needed.   Melony Overly, PA-C   This record has been created using [employer].  Chart creation errors have been sought, but may not always have been located and corrected. Such creation errors do not reflect on the standard of medical care.

## 2018-04-12 ENCOUNTER — Ambulatory Visit (INDEPENDENT_AMBULATORY_CARE_PROVIDER_SITE_OTHER): Payer: 59 | Admitting: Psychiatry

## 2018-04-12 DIAGNOSIS — F331 Major depressive disorder, recurrent, moderate: Secondary | ICD-10-CM | POA: Diagnosis not present

## 2018-04-12 NOTE — Progress Notes (Signed)
[facility] Counselor/Therapist Progress Note  Patient ID: WAH DILDINE, MRN: [id number],    Date: 04/12/2018  Time Spent: 60 minutes      2:55pm to 3:55pm  Treatment Type: Individual Therapy   Virtual Visit via Telephone Note I connected with patient by a video enabled telemedicine application or telephone, with their informed consent, and verified patient privacy and that I am speaking with the correct person using two identifiers. I am at [facility] and patient is at his home.   I discussed the limitations, risks, security and privacy concerns of performing psychotherapy and management service by telephone and the availability of in person appointments. I also discussed with the patient that there may be a patient responsible charge related to this service. The patient expressed understanding and agreed to proceed.  I discussed the treatment planning with the patient. The patient was provided an opportunity to ask questions and all were answered. The patient agreed with the plan and demonstrated an understanding of the instructions.   The patient was advised to call  our office if  symptoms worsen or feel they are in a crisis state and need immediate contact.   Reported Symptoms:  Fatigue, anxiety, some depressed mood  Mental Status Exam:  Appearance:   n/a  Behavior:  Sharing  Motor:  Normal  Speech/Language:   Normal Rate  Affect:  n/a  Mood:  anxious  Thought process:  normal  Thought content:    WNL  Sensory/Perceptual disturbances:    WNL  Orientation:  oriented to person, place, time/date, situation, day of week, month of year and year  Attention:  Fair  Concentration:  Fair  Memory:  WNL  Fund of knowledge:   Good  Insight:    Fair  Judgment:   Fair  Impulse Control:  Fair   Risk Assessment: Danger to Self:  No Self-injurious Behavior: No Danger to Others: No Duty to Warn:no Physical Aggression / Violence:No  Access to Firearms a concern: No   Gang Involvement:No   Subjective:   Patient today reports anxiety has increased during this time due to coronavirus situation and "dealing with difficult customers and some coworkers".  Processed his concerns, and anxiety about the coronavirus pandemic.  He is very concerned.  Is following the precautionary for the most part and I encouraged him to follow it more carefully.  Patient seems to be working hard to stay leveled out emotionally. Depressed mood is an issue but is "mild" usually depending on "what is happening." Reports no use of alcohol "since the bars shut down."    Still fatigued but not as bad.  Not working as much overtime and weekends now.  Sleep is usually better, with him getting 7-8 hours per night. Is on salary now at his job and adjusting to it and now sees some benefits.  Plays golf and enjoys that as a Land.  Reviewed strategies for better management of stress and anxiety, as well as depressed mood.   Interventions: Solution-Oriented/Positive Psychology and Ego-Supportive  Diagnosis:   ICD-10-CM   1. Major depressive disorder, recurrent episode, moderate (HCC) F33.1     Plan:    Continue on meds as prescribed.  Per note above, nurse was not onsite when we finished our appt so he will call Monday, March 16 about meds.   To continue healthier habits and strategies for managing anxiety.  Will see again in approx. 3 wks.  [name], LCSW     l

## 2018-05-03 ENCOUNTER — Ambulatory Visit (INDEPENDENT_AMBULATORY_CARE_PROVIDER_SITE_OTHER): Payer: 59 | Admitting: Psychiatry

## 2018-05-03 ENCOUNTER — Other Ambulatory Visit: Payer: Self-pay

## 2018-05-03 DIAGNOSIS — F331 Major depressive disorder, recurrent, moderate: Secondary | ICD-10-CM

## 2018-05-03 NOTE — Progress Notes (Signed)
      [facility] Counselor/Therapist Progress Note  Patient ID: Gregory Conley, MRN: [id number],    Date: 05/03/2018  Time Spent: 50 minutes      3:00pm to 3:50pm  Treatment Type: Individual Therapy   Virtual Visit Note I connected with patient by a video enabled telemedicine/telehealth application or telephone, with their informed consent, and verified patient privacy and that I am speaking with the correct person using two identifiers. I am at [facility] and patient is at his home.   I discussed the limitations, risks, security and privacy concerns of performing psychotherapy and management service by telephone and the availability of in person appointments. I also discussed with the patient that there may be a patient responsible charge related to this service. The patient expressed understanding and agreed to proceed.  I discussed the treatment planning with the patient. The patient was provided an opportunity to ask questions and all were answered. The patient agreed with the plan and demonstrated an understanding of the instructions.   The patient was advised to call  our office if  symptoms worsen or feel they are in a crisis state and need immediate contact.   Reported Symptoms:  anxiety, some depressed mood  Mental Status Exam:  Appearance:   N/a (teletherapy)  Behavior:  Sharing  Motor:  Normal  Speech/Language:   Normal Rate  Affect:  N/a (teletherapy)  Mood:  anxious  Thought process:  normal  Thought content:    WNL  Sensory/Perceptual disturbances:    WNL  Orientation:  oriented to person, place, time/date, situation, day of week, month of year and year  Attention:  Fair  Concentration:  Fair  Memory:  WNL  Fund of knowledge:   Good  Insight:    Fair  Judgment:   Fair  Impulse Control:  Fair   Risk Assessment: Danger to Self:  No Self-injurious Behavior: No Danger to Others: No Duty to Warn:no Physical Aggression / Violence:No  Access to  Firearms a concern: No  Gang Involvement:No   Subjective:    Patient reports doing a little better and that his depression and anxiety are mild right now.  States he is not feel nearly as tired as he was most of the time. Still reports limited alcohol use--had 1 beer and 1 shot of tequila since the  Coronavirus  "lockdown" started.  Not getting out much during the lockdown.  Goes to work, goes to walk around park to walk on weekends, goes to store when [place] Patient seems to be working hard to stay leveled out emotionally.  Plays golf and enjoys that as a Land.   His work site has has several emergencies recently which involved patient and we processed today.  Seemed less anxious after venting his concerns.   Reviewed strategies for better management of stress, anxiety, and mild depression.  Much less alcohol intake and has significantly decreased his smoking. Encouraged to continue the healthier choices he is making and he seems to be proud of himself.   Interventions: Solution-Oriented/Positive Psychology and Ego-Supportive  Diagnosis:   ICD-10-CM   1. Major depressive disorder, recurrent episode, moderate (HCC) F33.1     Plan:    Continue on meds as prescribed.  To continue healthier habits and strategies for better management of his symptoms and in making good choices.  Next session approx. 3 wks.  [name], LCSW     l

## 2018-05-17 ENCOUNTER — Other Ambulatory Visit: Payer: Self-pay

## 2018-05-17 ENCOUNTER — Ambulatory Visit (INDEPENDENT_AMBULATORY_CARE_PROVIDER_SITE_OTHER): Payer: 59 | Admitting: Psychiatry

## 2018-05-17 DIAGNOSIS — F331 Major depressive disorder, recurrent, moderate: Secondary | ICD-10-CM | POA: Diagnosis not present

## 2018-05-17 NOTE — Progress Notes (Signed)
      [facility] Counselor/Therapist Progress Note  Patient ID: Gregory Conley, MRN: [id number],    Date: 05/17/2018  Time Spent: 55 minutes      3:00pm to 3:55pm  Treatment Type: Individual Therapy   Virtual Visit Note I connected with patient by a video enabled telemedicine/telehealth application or telephone, with their informed consent, and verified patient privacy and that I am speaking with the correct person using two identifiers. I am at [facility] and patient is at his work office.   I discussed the limitations, risks, security and privacy concerns of performing psychotherapy and management service by telephone and the availability of in person appointments. I also discussed with the patient that there may be a patient responsible charge related to this service. The patient expressed understanding and agreed to proceed.  I discussed the treatment planning with the patient. The patient was provided an opportunity to ask questions and all were answered. The patient agreed with the plan and demonstrated an understanding of the instructions.   The patient was advised to call  our office if  symptoms worsen or feel they are in a crisis state and need immediate contact.   Reported Symptoms:  anxiety, depressed (both had improved some)  Mental Status Exam:  Appearance:   N/a (teletherapy)  Behavior:  Sharing  Motor:  Normal  Speech/Language:   Normal Rate  Affect:  N/a (teletherapy)  Mood:  anxious  Thought process:  normal  Thought content:    WNL  Sensory/Perceptual disturbances:    WNL  Orientation:  oriented to person, place, time/date, situation, day of week, month of year and year  Attention:  Fair  Concentration:  Fair  Memory:  WNL  Fund of knowledge:   Good  Insight:    Fair  Judgment:   Fair  Impulse Control:  Fair   Risk Assessment: Danger to Self:  No Self-injurious Behavior: No Danger to Others: No Duty to Warn:no Physical Aggression /  Violence:No  Access to Firearms a concern: No  Gang Involvement:No   Subjective:    Patient reports doing some better and that his depression and anxiety have improved.  Notices that he is doing some better with grief issues such as with his mom and all the circumstances that were difficult for him. Feels that he sleeps some better, and is more ready to get up in the mornings rather than lying in bed. More motivated. More tolerant and less quick to get angry and feels that he manages anger better.    Has really decreased his alcohol consumption which is really good for patient.  Has cut his smoking cigarettes from a pack to a half-pack.  Still getting out to play golf occasionally "and that helps my mood".  Reviewed strategies for continued better management of stress, anxiety, and depression.  Encouraged to continue the healthier choices he is making especially the reduction of alcohol and smoking, and he seems to be proud of himself.   Interventions: Solution-Oriented/Positive Psychology and Ego-Supportive  Diagnosis:   ICD-10-CM   1. Major depressive disorder, recurrent episode, moderate (HCC) F33.1     Plan:    Continue on meds as prescribed.  To continue healthier habits and strategies for better management of his symptoms and in making good choices.  Next session approx. 2-3 wks.  [name], LCSW     l

## 2018-05-24 ENCOUNTER — Ambulatory Visit: Payer: 59 | Admitting: Psychiatry

## 2018-05-31 ENCOUNTER — Other Ambulatory Visit: Payer: Self-pay

## 2018-05-31 ENCOUNTER — Ambulatory Visit: Payer: 59 | Admitting: Psychiatry

## 2018-06-23 ENCOUNTER — Other Ambulatory Visit: Payer: Self-pay | Admitting: Physician Assistant

## 2018-06-24 NOTE — Telephone Encounter (Signed)
Please clarify dose, last visit says 1 daily the order is tid?

## 2018-10-04 ENCOUNTER — Ambulatory Visit (INDEPENDENT_AMBULATORY_CARE_PROVIDER_SITE_OTHER): Payer: 59 | Admitting: Physician Assistant

## 2018-10-04 ENCOUNTER — Other Ambulatory Visit: Payer: Self-pay

## 2018-10-04 ENCOUNTER — Encounter: Payer: Self-pay | Admitting: Physician Assistant

## 2018-10-04 DIAGNOSIS — F411 Generalized anxiety disorder: Secondary | ICD-10-CM

## 2018-10-04 DIAGNOSIS — F331 Major depressive disorder, recurrent, moderate: Secondary | ICD-10-CM | POA: Diagnosis not present

## 2018-10-04 MED ORDER — CARBAMAZEPINE 200 MG PO TABS: 200.0000 mg | ORAL_TABLET | Freq: Every day | ORAL | 1 refills | Status: DC

## 2018-10-04 MED ORDER — BUPROPION HCL ER (XL) 150 MG PO TB24: 150.0000 mg | ORAL_TABLET | Freq: Every day | ORAL | 1 refills | Status: DC
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
## 2019-04-28 ENCOUNTER — Other Ambulatory Visit: Payer: Self-pay | Admitting: Physician Assistant

## 2019-06-27 ENCOUNTER — Other Ambulatory Visit: Payer: Self-pay | Admitting: Physician Assistant

## 2019-06-30 NOTE — Telephone Encounter (Signed)
Last apt 10/04/2018, due back 6 months

## 2019-07-25 ENCOUNTER — Other Ambulatory Visit: Payer: Self-pay | Admitting: Physician Assistant

## 2019-07-25 NOTE — Telephone Encounter (Signed)
Last apt 09/2018, nothing scheduled

## 2019-10-13 ENCOUNTER — Other Ambulatory Visit: Payer: 59

## 2019-10-13 DIAGNOSIS — Z20822 Contact with and (suspected) exposure to covid-19: Secondary | ICD-10-CM

## 2019-10-13 NOTE — Addendum Note (Signed)
Addended by: Amaru Burroughs E on: 10/13/2019 06:24 PM   Modules accepted: Orders  

## 2019-10-13 NOTE — Addendum Note (Signed)
Addended by: Marin Shutter E on: 10/13/2019 05:51 PM   Modules accepted: Orders

## 2019-10-14 LAB — NOVEL CORONAVIRUS, NAA: SARS-CoV-2, NAA: NOT DETECTED

## 2019-10-14 LAB — SARS-COV-2, NAA 2 DAY TAT
